# Patient Record
Sex: Female | Born: 1975 | Race: Black or African American | Hispanic: No | Marital: Single | Smoking: Former smoker
Health system: Southern US, Community
[De-identification: ages and names within clinical notes are randomized; demographics above are authoritative.]

## PROBLEM LIST (undated history)

## (undated) DIAGNOSIS — E559 Vitamin D deficiency, unspecified: Secondary | ICD-10-CM

## (undated) DIAGNOSIS — D573 Sickle-cell trait: Secondary | ICD-10-CM

## (undated) DIAGNOSIS — M722 Plantar fascial fibromatosis: Secondary | ICD-10-CM

## (undated) DIAGNOSIS — F32A Depression, unspecified: Secondary | ICD-10-CM

## (undated) DIAGNOSIS — F329 Major depressive disorder, single episode, unspecified: Secondary | ICD-10-CM

## (undated) HISTORY — PX: BREAST EXCISIONAL BIOPSY: SUR124

## (undated) HISTORY — PX: INTRAUTERINE DEVICE (IUD) INSERTION: SHX5877

## (undated) HISTORY — DX: Major depressive disorder, single episode, unspecified: F32.9

## (undated) HISTORY — PX: BREAST SURGERY: SHX581

## (undated) HISTORY — DX: Depression, unspecified: F32.A

## (undated) HISTORY — DX: Vitamin D deficiency, unspecified: E55.9

---

## 2007-02-04 ENCOUNTER — Ambulatory Visit: Payer: Self-pay | Admitting: Family Medicine

## 2007-02-07 ENCOUNTER — Encounter: Payer: Self-pay | Admitting: Family Medicine

## 2007-02-07 LAB — CONVERTED CEMR LAB
AST: 11 units/L (ref 0–37)
Alkaline Phosphatase: 34 units/L — ABNORMAL LOW (ref 39–117)
BUN: 6 mg/dL (ref 6–23)
Creatinine, Ser: 0.56 mg/dL (ref 0.40–1.20)
HCT: 34 % — ABNORMAL LOW (ref 36.0–46.0)
HDL: 52 mg/dL (ref >39)
Hemoglobin: 11.5 g/dL — ABNORMAL LOW (ref 12.0–15.0)
LDL Cholesterol: 86 mg/dL (ref 0–99)
MCHC: 33.8 g/dL (ref 30.0–36.0)
Platelets: 266 10*3/uL (ref 150–400)
RDW: 13.9 % (ref 11.5–14.0)
Total Bilirubin: 0.7 mg/dL (ref 0.3–1.2)
Total CHOL/HDL Ratio: 2.8
VLDL: 9 mg/dL (ref 0–40)

## 2007-02-08 ENCOUNTER — Telehealth (INDEPENDENT_AMBULATORY_CARE_PROVIDER_SITE_OTHER): Payer: Self-pay | Admitting: *Deleted

## 2007-02-08 ENCOUNTER — Encounter: Payer: Self-pay | Admitting: Family Medicine

## 2007-02-08 LAB — CONVERTED CEMR LAB: Ferritin: 59 ng/mL (ref 10–291)

## 2007-02-11 ENCOUNTER — Telehealth: Payer: Self-pay | Admitting: Family Medicine

## 2008-06-08 ENCOUNTER — Other Ambulatory Visit: Admission: RE | Admit: 2008-06-08 | Discharge: 2008-06-08 | Payer: Self-pay | Admitting: Family Medicine

## 2008-06-08 ENCOUNTER — Ambulatory Visit: Payer: Self-pay | Admitting: Family Medicine

## 2008-06-08 ENCOUNTER — Encounter: Payer: Self-pay | Admitting: Family Medicine

## 2008-06-09 LAB — CONVERTED CEMR LAB
Hemoglobin: 12.6 g/dL (ref 12.0–15.0)
RBC: 4.38 M/uL (ref 3.87–5.11)
RDW: 13.5 % (ref 11.5–15.5)
Saturation Ratios: 26 % (ref 20–55)
TIBC: 326 ug/dL (ref 250–470)
WBC: 6.2 10*3/uL (ref 4.0–10.5)

## 2008-09-09 ENCOUNTER — Emergency Department (HOSPITAL_BASED_OUTPATIENT_CLINIC_OR_DEPARTMENT_OTHER): Admission: EM | Admit: 2008-09-09 | Discharge: 2008-09-09 | Payer: Self-pay | Admitting: Emergency Medicine

## 2012-09-28 DIAGNOSIS — L709 Acne, unspecified: Secondary | ICD-10-CM | POA: Insufficient documentation

## 2012-09-28 DIAGNOSIS — D573 Sickle-cell trait: Secondary | ICD-10-CM | POA: Insufficient documentation

## 2014-08-12 ENCOUNTER — Ambulatory Visit (INDEPENDENT_AMBULATORY_CARE_PROVIDER_SITE_OTHER): Payer: 59 | Admitting: Family Medicine

## 2014-08-12 ENCOUNTER — Encounter: Payer: Self-pay | Admitting: Family Medicine

## 2014-08-12 VITALS — BP 118/61 | HR 70 | Ht 68.0 in | Wt 200.0 lb

## 2014-08-12 DIAGNOSIS — Z800 Family history of malignant neoplasm of digestive organs: Secondary | ICD-10-CM | POA: Diagnosis not present

## 2014-08-12 DIAGNOSIS — Z0000 Encounter for general adult medical examination without abnormal findings: Secondary | ICD-10-CM

## 2014-08-12 DIAGNOSIS — Z8742 Personal history of other diseases of the female genital tract: Secondary | ICD-10-CM | POA: Diagnosis not present

## 2014-08-12 DIAGNOSIS — D573 Sickle-cell trait: Secondary | ICD-10-CM | POA: Diagnosis not present

## 2014-08-12 NOTE — Progress Notes (Signed)
CC: Alexandra Turner is a 39 y.o. female is here for Establish Care   Subjective: HPI:  Colonoscopy: Mom age 38, screening to begin at age 38 Papsmear: history of abnormals but this is managed by her OB/GYN, due for repeat reminded to follow-up with GYN. 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  Requesting complete physical exam with no complaints other than fatigue. Works 2 jobs, single mother of a 38-year-old, works out 5 times a week. Still feels like she is struggling with energy on a daily basis but denies nonrestorative sleep. No shortness of breath.  Review of Systems - General ROS: negative for - chills, fever, night sweats, weight gain or weight loss Ophthalmic ROS: negative for - decreased vision Psychological ROS: negative for - anxiety or depression ENT ROS: negative for - hearing change, nasal congestion, tinnitus or allergies Hematological and Lymphatic ROS: negative for - bleeding problems, bruising or swollen lymph nodes Breast ROS: negative Respiratory ROS: no cough, shortness of breath, or wheezing Cardiovascular ROS: no chest pain or dyspnea on exertion Gastrointestinal ROS: no abdominal pain, change in bowel habits, or black or bloody stools Genito-Urinary ROS: negative for - genital discharge, genital ulcers, incontinence or abnormal bleeding from genitals Musculoskeletal ROS: negative for - joint pain or muscle pain Neurological ROS: negative for - headaches or memory loss Dermatological ROS: negative for lumps, mole changes, rash and skin lesion changes  No past medical history on file.  No past surgical history on file. No family history on file.  History   Social  History  . Marital Status: Single    Spouse Name: N/A  . Number of Children: N/A  . Years of Education: N/A   Occupational History  . Not on file.   Social History Main Topics  . Smoking status: Not on file  . Smokeless tobacco: Not on file  . Alcohol Use: Not on file  . Drug Use: Not on file  . Sexual Activity: Not on file   Other Topics Concern  . Not on file   Social History Narrative  . No narrative on file     Objective: BP 118/61 mmHg  Pulse 70  Ht 5\' 8"  (1.727 m)  Wt 200 lb (90.719 kg)  BMI 30.42 kg/m2  General: No Acute Distress HEENT: Atraumatic, normocephalic, conjunctivae normal without scleral icterus.  No nasal discharge, hearing grossly intact, TMs with good landmarks bilaterally with no middle ear abnormalities, posterior pharynx clear without oral lesions. Neck: Supple, trachea midline, no cervical nor supraclavicular adenopathy. Pulmonary: Clear to auscultation bilaterally without wheezing, rhonchi, nor rales. Cardiac: Regular rate and rhythm.  No murmurs, rubs, nor gallops. No peripheral edema.  2+ peripheral pulses bilaterally. Abdomen: Bowel sounds normal.  No masses.  Non-tender without rebound.  Negative Murphy's sign. MSK: Grossly intact, no signs of weakness.  Full strength throughout upper and lower extremities.  Full ROM in upper and lower extremities.  No midline spinal tenderness. Neuro: Gait unremarkable, CN II-XII grossly intact.  C5-C6 Reflex 2/4 Bilaterally, L4 Reflex 2/4 Bilaterally.  Cerebellar function intact. Skin: No rashes. Psych: Alert and oriented to person/place/time.  Thought process normal. No anxiety/depression.  Assessment & Plan: [name] was seen today for establish care.  Diagnoses and all orders  for this visit:  Annual physical exam Orders: -     Lipid panel -     COMPLETE METABOLIC PANEL WITH GFR -     TSH -     CBC  Sickle cell trait  Family history of colon cancer  History of abnormal cervical Pap  smear   Healthy lifestyle interventions including but not limited to regular exercise, a healthy low fat diet, moderation of salt intake, the dangers of tobacco/alcohol/recreational drug use, nutrition supplementation, and accident avoidance were discussed with the patient and a handout was provided for future reference.  No Follow-up on file.

## 2014-08-19 LAB — CBC
HCT: 35.1 % — ABNORMAL LOW (ref 36.0–46.0)
HEMOGLOBIN: 11.5 g/dL — AB (ref 12.0–15.0)
MCH: 27.1 pg (ref 26.0–34.0)
MCHC: 32.8 g/dL (ref 30.0–36.0)
MCV: 82.8 fL (ref 78.0–100.0)
MPV: 9.8 fL (ref 8.6–12.4)
Platelets: 311 10*3/uL (ref 150–400)
RBC: 4.24 MIL/uL (ref 3.87–5.11)
RDW: 14.8 % (ref 11.5–15.5)
WBC: 3.9 10*3/uL — ABNORMAL LOW (ref 4.0–10.5)

## 2014-08-20 LAB — TSH: TSH: 0.717 u[IU]/mL (ref 0.350–4.500)

## 2014-08-20 LAB — COMPLETE METABOLIC PANEL WITH GFR
ALBUMIN: 4.2 g/dL (ref 3.5–5.2)
ALT: 15 U/L (ref 0–35)
AST: 21 U/L (ref 0–37)
Alkaline Phosphatase: 61 U/L (ref 39–117)
BUN: 7 mg/dL (ref 6–23)
CALCIUM: 9.5 mg/dL (ref 8.4–10.5)
CHLORIDE: 104 meq/L (ref 96–112)
CO2: 25 meq/L (ref 19–32)
CREATININE: 0.68 mg/dL (ref 0.50–1.10)
GFR, Est Non African American: >89 mL/min
Glucose, Bld: 84 mg/dL (ref 70–99)
POTASSIUM: 4.3 meq/L (ref 3.5–5.3)
Sodium: 141 mEq/L (ref 135–145)
Total Bilirubin: 0.6 mg/dL (ref 0.2–1.2)
Total Protein: 7.4 g/dL (ref 6.0–8.3)

## 2014-08-20 LAB — LIPID PANEL
CHOLESTEROL: 169 mg/dL (ref 0–200)
HDL: 72 mg/dL (ref >=46)
LDL Cholesterol: 86 mg/dL (ref 0–99)
TRIGLYCERIDES: 54 mg/dL (ref <150)
Total CHOL/HDL Ratio: 2.3 Ratio
VLDL: 11 mg/dL (ref 0–40)

## 2016-02-14 ENCOUNTER — Emergency Department (INDEPENDENT_AMBULATORY_CARE_PROVIDER_SITE_OTHER): Payer: 59

## 2016-02-14 ENCOUNTER — Emergency Department (INDEPENDENT_AMBULATORY_CARE_PROVIDER_SITE_OTHER)
Admission: EM | Admit: 2016-02-14 | Discharge: 2016-02-14 | Disposition: A | Discharge status: Home / Self Care | Payer: 59 | Source: Home / Self Care | Attending: Family Medicine | Admitting: Family Medicine

## 2016-02-14 ENCOUNTER — Encounter: Payer: Self-pay | Admitting: *Deleted

## 2016-02-14 DIAGNOSIS — M47896 Other spondylosis, lumbar region: Secondary | ICD-10-CM

## 2016-02-14 DIAGNOSIS — S39012A Strain of muscle, fascia and tendon of lower back, initial encounter: Secondary | ICD-10-CM

## 2016-02-14 HISTORY — DX: Sickle-cell trait: D57.3

## 2016-02-14 MED ORDER — MELOXICAM 15 MG PO TABS: 15.0000 mg | ORAL_TABLET | Freq: Every day | ORAL | 1 refills | Status: DC

## 2016-02-14 MED ORDER — CYCLOBENZAPRINE HCL 10 MG PO TABS: ORAL_TABLET | ORAL | 1 refills | Status: DC

## 2016-02-14 MED ORDER — IBUPROFEN 400 MG PO TABS
400.0000 mg | ORAL_TABLET | Freq: Once | ORAL | Status: AC
  Administered 2016-02-14: 400 mg via ORAL

## 2016-02-14 NOTE — ED Triage Notes (Signed)
Pt reports left low back pain that started suddenly this AM when she coughed and heard a pop. She has decreased ROM. Within the past 2 weeks c/o some low back pain after exercising that was relieved by exercises.

## 2016-02-14 NOTE — Discharge Instructions (Addendum)
Apply ice pack for 20 to 30 minutes, 3 to 4 times daily  Continue until pain decreases.  °Begin range of motion and stretching exercises as tolerated. °

## 2016-02-14 NOTE — ED Provider Notes (Signed)
[facility]    CSN: [id number] Arrival date & time: 02/14/16  0901     History   Chief Complaint Chief Complaint  Patient presents with  . Back Pain    HPI Alexandra Turner is a 40 y.o. female.   Patient has noticed mild low back pain after exercising during the past two weeks.  She works out regularly.  Last week after using a leg press machine she was more sore than usual.  This morning while in her shower she coughed and felt sudden pain in her left lower back.  The pain does not radiate.  She denies bowel or bladder dysfunction, and no saddle numbness.    The history is provided by the patient and a parent.  Back Pain  Location:  Lumbar spine Quality:  Aching Radiates to:  Does not radiate Pain severity:  Moderate Pain is:  Same all the time Onset quality:  Sudden Duration:  3 hours Timing:  Constant Progression:  Unchanged Chronicity:  New Context comment:  Athletic activity Relieved by:  None tried Worsened by:  Movement Ineffective treatments:  None tried Associated symptoms: no abdominal pain, no abdominal swelling, no bladder incontinence, no bowel incontinence, no dysuria, no fever, no leg pain, no numbness, no paresthesias, no pelvic pain, no perianal numbness, no tingling and no weakness     Past Medical History:  Diagnosis Date  . Sickle cell trait [facility])     Patient Active Problem List   Diagnosis Date Noted  . Sickle cell trait (HCC) 08/12/2014  . Family history of colon cancer 08/12/2014  . History of abnormal cervical Pap smear 08/12/2014    Past Surgical History:  Procedure Laterality Date  . BREAST SURGERY     excision  . CESAREAN SECTION      OB History    No data available       Home Medications    Prior to Admission medications   Medication Sig Start Date End Date Taking? Authorizing Provider  cyclobenzaprine (FLEXERIL) 10 MG tablet Take one tab by mouth HS for muscle spasm. 02/14/16   Lattie Haw, MD  meloxicam  (MOBIC) 15 MG tablet Take 1 tablet (15 mg total) by mouth daily. Take with food each morning 02/14/16   Lattie Haw, MD    Family History Family History  Problem Relation Age of Onset  . Cancer Mother   . Cancer Sister     Social History Social History  Substance Use Topics  . Smoking status: Never Smoker  . Smokeless tobacco: Never Used  . Alcohol use 4.2 oz/week    7 Standard drinks or equivalent per week     Allergies   Review of patient's allergies indicates no known allergies.   Review of Systems Review of Systems  Constitutional: Negative for fever.  Gastrointestinal: Negative for abdominal pain and bowel incontinence.  Genitourinary: Negative for bladder incontinence, dysuria and pelvic pain.  Musculoskeletal: Positive for back pain.  Neurological: Negative for tingling, weakness, numbness and paresthesias.  All other systems reviewed and are negative.    Physical Exam Triage Vital Signs ED Triage Vitals  Enc Vitals Group     BP 02/14/16 0919 112/74     Pulse Rate 02/14/16 0919 68     Resp --      Temp 02/14/16 0919 98.2 F (36.8 C)     Temp Source 02/14/16 0919 Oral     SpO2 02/14/16 0919 99 %     Weight  02/14/16 0919 186 lb (84.4 kg)     Height --      Head Circumference --      Peak Flow --      Pain Score 02/14/16 0922 9     Pain Loc --      Pain Edu? --      Excl. in GC? --    No data found.   Updated Vital Signs BP 112/74 (BP Location: Left Arm)   Pulse 68   Temp 98.2 F (36.8 C) (Oral)   Wt 186 lb (84.4 kg)   LMP 01/20/2016   SpO2 99%   BMI 28.28 kg/m   Visual Acuity Right Eye Distance:   Left Eye Distance:   Bilateral Distance:    Right Eye Near:   Left Eye Near:    Bilateral Near:     Physical Exam  Constitutional: She appears well-developed and well-nourished. No distress.  HENT:  Head: Normocephalic.  Mouth/Throat: Oropharynx is clear and moist.  Eyes: Conjunctivae are normal. Pupils are equal, round, and  reactive to light.  Neck: Normal range of motion.  Cardiovascular: Normal heart sounds.   Pulmonary/Chest: Breath sounds normal.  Abdominal: There is no tenderness.  Musculoskeletal: She exhibits no edema.       Lumbar back: She exhibits tenderness. She exhibits normal range of motion, no bony tenderness and no swelling.       Back:  Back:   Can heel/toe walk and squat without difficulty.  Decreased forward flexion.  Tenderness in the midline and left paraspinous muscles from L3 to Sacral area.  Tenderness over SI joint.  Straight leg raising test is negative.  Sitting knee extension test is negative.  Strength and sensation in the lower extremities is normal.  Patellar and achilles reflexes are normal   Neurological: She is alert.  Skin: Skin is warm and dry. No rash noted.  Nursing note and vitals reviewed.    UC Treatments / Results  Labs (all labs ordered are listed, but only abnormal results are displayed) Labs Reviewed - No data to display  EKG  EKG Interpretation None       Radiology Dg Lumbar Spine Complete  Result Date: 02/14/2016 CLINICAL DATA:  Left lower back pain for 4-5 days EXAM: LUMBAR SPINE - COMPLETE 4+ VIEW COMPARISON:  None. FINDINGS: There are 5 nonrib bearing lumbar-type vertebral bodies. The vertebral body heights are maintained. There is 2 mm of retrolisthesis of L4 on L5. There is no spondylolysis. There is no acute fracture. There is minimal degenerative disc disease at L4-5 and L5-S1. The SI joints are unremarkable. IMPRESSION: Mild lumbar spine spondylosis. Electronically Signed   By: Elige KoHetal  Patel   On: 02/14/2016 10:41    Procedures Procedures (including critical care time)  Medications Ordered in UC Medications  ibuprofen (ADVIL,MOTRIN) tablet 400 mg (400 mg Oral Given 02/14/16 [id number])     Initial Impression / Assessment and Plan / UC Course  I have reviewed the triage vital signs and the nursing notes.  Pertinent labs & imaging results that  were available during my care of the patient were reviewed by me and considered in my medical decision making (see chart for details).  Clinical Course  No evidence radiculopathy. Begin Mobic and Flexeril. Apply ice pack for 20 to 30 minutes, 3 to 4 times daily  Continue until pain decreases. Begin range of motion and stretching exercises as tolerated. Followup with Dr. Rodney Langtonhomas Thekkekandam or Dr. Clementeen GrahamEvan Corey ([facility]) if not  improving about two weeks.      Final Clinical Impressions(s) / UC Diagnoses   Final diagnoses:  Low back strain, initial encounter    New Prescriptions New Prescriptions   CYCLOBENZAPRINE (FLEXERIL) 10 MG TABLET    Take one tab by mouth HS for muscle spasm.   MELOXICAM (MOBIC) 15 MG TABLET    Take 1 tablet (15 mg total) by mouth daily. Take with food each morning     Lattie Haw, MD 02/18/16 [id number]

## 2016-03-02 ENCOUNTER — Encounter: Payer: Self-pay | Admitting: Family Medicine

## 2016-03-02 ENCOUNTER — Ambulatory Visit (INDEPENDENT_AMBULATORY_CARE_PROVIDER_SITE_OTHER): Payer: 59 | Admitting: Family Medicine

## 2016-03-02 DIAGNOSIS — S39012A Strain of muscle, fascia and tendon of lower back, initial encounter: Secondary | ICD-10-CM | POA: Diagnosis not present

## 2016-03-02 NOTE — Progress Notes (Signed)
   Subjective:    I'm seeing this patient as a consultation for:  Dr. Donna ChristenStephen Beese  CC: Back pain  HPI: Patient first had left-sided achy lower back pain after using a leg press machine at the gym 1 month ago. The pain does not radiate anywhere. She denies numbness, tingling, or weakness in her lower extremities; no bowel or bladder incontinence. Her physical therapist friend gave her leg lift and abduction exercises that helped the pain somewhat. 2 weeks ago, she was standing in the shower when she coughed and felt sharp pain in her left hip and lower back. She then went to urgent care, where she was diagnosed with a low back strain and given cyclobenzaprine, meloxicam, and advised to ice/heath and do ROM/stretching exercises. Her pain improved until this past weekend, when it gradually worsened after a gym workout involving body weight squats and leg machines. She feels that the PT exercises and heat have helped her pain the most.  Past medical history, Surgical history, Family history not pertinant except as noted below, Social history, Allergies, and medications have been entered into the medical record, reviewed, and no changes needed.   Review of Systems: No headache, visual changes, nausea, vomiting, diarrhea, constipation, dizziness, abdominal pain, skin rash, fevers, chills, night sweats, weight loss, swollen lymph nodes, joint swelling, chest pain, shortness of breath, mood changes, visual or auditory hallucinations.   Objective:    Vitals:   03/02/16 1445  BP: 116/71  Pulse: (!) 105   General: Well Developed, well nourished, and in no acute distress.  Neuro/Psych: Alert and oriented x3, extra-ocular muscles intact, able to move all 4 extremities, sensation grossly intact. Skin: Warm and dry, no rashes noted.  Respiratory: Not using accessory muscles, speaking in full sentences, trachea midline.  Cardiovascular: Pulses palpable, no extremity edema. Abdomen: Does not appear  distended. MSK: Tenderness to palpation over left SI joint. Pearlean BrownieFaber and Fader tests normal bilaterally; pretzel stretch painful on L side. Pressure over pelvis yielded less SI joint movement on left side. Slump test negative. Full strength and intact sensation in bilateral lower extremities. Patellar reflexes 2+ bilaterally.  Xray Lspine dated 10/23: CLINICAL DATA:  Left lower back pain for 4-5 days  EXAM: LUMBAR SPINE - COMPLETE 4+ VIEW  COMPARISON:  None.  FINDINGS: There are 5 nonrib bearing lumbar-type vertebral bodies.  The vertebral body heights are maintained.  There is 2 mm of retrolisthesis of L4 on L5. There is no spondylolysis.  There is no acute fracture.  There is minimal degenerative disc disease at L4-5 and L5-S1.  The SI joints are unremarkable.  IMPRESSION: Mild lumbar spine spondylosis.   Electronically Signed   By: Elige KoHetal  Patel   On: 02/14/2016 10:41  Impression and Recommendations:    Assessment and Plan: 40 y.o. female with 1 month of pain over the left SI joint precipitated and exacerbated by exercise. Pain reproduced with point palpation, pretzel stretch, and application of pelvic pressure on exam. Most likely acute myofascial back strain. Unofficial PT has helped her in the past. Referred to PT. Continue applying heat. Follow up in 2-4 weeks if not better.   Discussed warning signs or symptoms. Please see discharge instructions. Patient expresses understanding.

## 2016-03-02 NOTE — Patient Instructions (Signed)
Thank you for coming in today. Return in 2-4 weeks if not better.  Come back or go to the emergency room if you notice new weakness new numbness problems walking or bowel or bladder problems.  TENS UNIT: This is helpful for muscle pain and spasm.   Search and Purchase a TENS 7000 2nd edition at www.tenspros.com. Or [employer] It should be less than $30.     TENS unit instructions: Do not shower or bathe with the unit on Turn the unit off before removing electrodes or batteries If the electrodes lose stickiness add a drop of water to the electrodes after they are disconnected from the unit and place on plastic sheet. If you continued to have difficulty, call the TENS unit company to purchase more electrodes. Do not apply lotion on the skin area prior to use. Make sure the skin is clean and dry as this will help prolong the life of the electrodes. After use, always check skin for unusual red areas, rash or other skin difficulties. If there are any skin problems, does not apply electrodes to the same area. Never remove the electrodes from the unit by pulling the wires. Do not use the TENS unit or electrodes other than as directed. Do not change electrode placement without consultating your therapist or physician. Keep 2 fingers with between each electrode. Wear time ratio is 2:1, on to off times.    For example on for 30 minutes off for 15 minutes and then on for 30 minutes off for 15 minutes    Lumbosacral Strain Lumbosacral strain is a strain of any of the parts that make up your lumbosacral vertebrae. Your lumbosacral vertebrae are the bones that make up the lower third of your backbone. Your lumbosacral vertebrae are held together by muscles and tough, fibrous tissue (ligaments).  CAUSES  A sudden blow to your back can cause lumbosacral strain. Also, anything that causes an excessive stretch of the muscles in the low back can cause this strain. This is typically seen when people exert  themselves strenuously, fall, lift heavy objects, bend, or crouch repeatedly. RISK FACTORS  Physically demanding work.  Participation in pushing or pulling sports or sports that require a sudden twist of the back (tennis, golf, baseball).  Weight lifting.  Excessive lower back curvature.  Forward-tilted pelvis.  Weak back or abdominal muscles or both.  Tight hamstrings. SIGNS AND SYMPTOMS  Lumbosacral strain may cause pain in the area of your injury or pain that moves (radiates) down your leg.  DIAGNOSIS Your health care provider can often diagnose lumbosacral strain through a physical exam. In some cases, you may need tests such as X-ray exams.  TREATMENT  Treatment for your lower back injury depends on many factors that your clinician will have to evaluate. However, most treatment will include the use of anti-inflammatory medicines. HOME CARE INSTRUCTIONS   Avoid hard physical activities (tennis, racquetball, waterskiing) if you are not in proper physical condition for it. This may aggravate or create problems.  If you have a back problem, avoid sports requiring sudden body movements. Swimming and walking are generally safer activities.  Maintain good posture.  Maintain a healthy weight.  For acute conditions, you may put ice on the injured area.  Put ice in a plastic bag.  Place a towel between your skin and the bag.  Leave the ice on for 20 minutes, 2-3 times a day.  When the low back starts healing, stretching and strengthening exercises may be recommended. SEEK  MEDICAL CARE IF:  Your back pain is getting worse.  You experience severe back pain not relieved with medicines. SEEK IMMEDIATE MEDICAL CARE IF:   You have numbness, tingling, weakness, or problems with the use of your arms or legs.  There is a change in bowel or bladder control.  You have increasing pain in any area of the body, including your belly (abdomen).  You notice shortness of breath,  dizziness, or feel faint.  You feel sick to your stomach (nauseous), are throwing up (vomiting), or become sweaty.  You notice discoloration of your toes or legs, or your feet get very cold. MAKE SURE YOU:   Understand these instructions.  Will watch your condition.  Will get help right away if you are not doing well or get worse.   This information is not intended to replace advice given to you by your health care provider. Make sure you discuss any questions you have with your health care provider.   Document Released: 01/18/2005 Document Revised: 05/01/2014 Document Reviewed: 11/27/2012 [employer] Interactive Patient Education [employer]2016 [employer]

## 2016-03-13 ENCOUNTER — Ambulatory Visit (INDEPENDENT_AMBULATORY_CARE_PROVIDER_SITE_OTHER): Payer: 59 | Admitting: Rehabilitative and Restorative Service Providers"

## 2016-03-13 ENCOUNTER — Encounter: Payer: Self-pay | Admitting: Rehabilitative and Restorative Service Providers"

## 2016-03-13 DIAGNOSIS — M545 Low back pain, unspecified: Secondary | ICD-10-CM

## 2016-03-13 DIAGNOSIS — R29898 Other symptoms and signs involving the musculoskeletal system: Secondary | ICD-10-CM

## 2016-03-13 NOTE — Patient Instructions (Signed)
Abdominal Bracing With Pelvic Floor (Hook-Lying)    With neutral spine, tighten pelvic floor and abdominals sucking belly button to back bone; tighten muscles in low back at waist. Hold 10 sec (or longer)  Repeat _10_ times.  Do _several__ times a day. Progress to do this in sitting; standing; walking and with functional activities and in the gym   Quads / HF, Supine   Lie near edge of bed, pull both knees up toward chest. Hold one knee as you drop the other leg off the edge of the bed.  Relax hanging knee/can bend knee back if indicated. Hold 30 seconds. Repeat 3 times per session. Do 2-3 sessions per day.  Cat / Cow Flow    Inhale, press spine toward ceiling like a Halloween cat. Keeping strength in arms and abdominals, exhale to soften spine through neutral and into cow pose. Open chest and arch back. Initiate movement between cat and cow at tailbone, one vertebrae at a time. Repeat __5__ times.   Sleeping on Back  Place pillow under knees. A pillow with cervical support and a roll around waist are also helpful. Copyright  VHI. All rights reserved.  Sleeping on Side Place pillow between knees. Use cervical support under neck and a roll around waist as needed. Copyright  VHI. All rights reserved.   Sleeping on Stomach   If this is the only desirable sleeping position, place pillow under lower legs, and under stomach or chest as needed.  Posture - Sitting   Sit upright, head facing forward. Try using a roll to support lower back. Keep shoulders relaxed, and avoid rounded back. Keep hips level with knees. Avoid crossing legs for long periods. Stand to Sit / Sit to Stand   To sit: Bend knees to lower self onto front edge of chair, then scoot back on seat. To stand: Reverse sequence by placing one foot forward, and scoot to front of seat. Use rocking motion to stand up.   Work Height and Reach  Ideal work height is no more than 2 to 4 inches below elbow level when  standing, and at elbow level when sitting. Reaching should be limited to arm's length, with elbows slightly bent.  Bending  Bend at hips and knees, not back. Keep feet shoulder-width apart.    Posture - Standing   Good posture is important. Avoid slouching and forward head thrust. Maintain curve in low back and align ears over shoul- ders, hips over ankles.  Alternating Positions   Alternate tasks and change positions frequently to reduce fatigue and muscle tension. Take rest breaks. Computer Work   Position work to Art gallery managerface forward. Use proper work and seat height. Keep shoulders back and down, wrists straight, and elbows at right angles. Use chair that provides full back support. Add footrest and lumbar roll as needed.  Getting Into / Out of Car  Lower self onto seat, scoot back, then bring in one leg at a time. Reverse sequence to get out.  Dressing  Lie on back to pull socks or slacks over feet, or sit and bend leg while keeping back straight.    Housework - Sink  Place one foot on ledge of cabinet under sink when standing at sink for prolonged periods.   Pushing / Pulling  Pushing is preferable to pulling. Keep back in proper alignment, and use leg muscles to do the work.  Deep Squat   Squat and lift with both arms held against upper trunk. Tighten stomach muscles without  holding breath. Use smooth movements to avoid jerking.  Avoid Twisting   Avoid twisting or bending back. Pivot around using foot movements, and bend at knees if needed when reaching for articles.  Carrying Luggage   Distribute weight evenly on both sides. Use a cart whenever possible. Do not twist trunk. Move body as a unit.   Lifting Principles .Maintain proper posture and head alignment. .Slide object as close as possible before lifting. .Move obstacles out of the way. .Test before lifting; ask for help if too heavy. .Tighten stomach muscles without holding breath. .Use smooth  movements; do not jerk. .Use legs to do the work, and pivot with feet. .Distribute the work load symmetrically and close to the center of trunk. .Push instead of pull whenever possible.   Ask For Help   Ask for help and delegate to others when possible. Coordinate your movements when lifting together, and maintain the low back curve.  Log Roll   Lying on back, bend left knee and place left arm across chest. Roll all in one movement to the right. Reverse to roll to the left. Always move as one unit. Housework - Sweeping  Use long-handled equipment to avoid stooping.   Housework - Wiping  Position yourself as close as possible to reach work surface. Avoid straining your back.  Laundry - Unloading Wash   To unload small items at bottom of washer, lift leg opposite to arm being used to reach.  Gardening - Raking  Move close to area to be raked. Use arm movements to do the work. Keep back straight and avoid twisting.     Cart  When reaching into cart with one arm, lift opposite leg to keep back straight.   Getting Into / Out of Bed  Lower self to lie down on one side by raising legs and lowering head at the same time. Use arms to assist moving without twisting. Bend both knees to roll onto back if desired. To sit up, start from lying on side, and use same move-ments in reverse. Housework - Vacuuming  Hold the vacuum with arm held at side. Step back and forth to move it, keeping head up. Avoid twisting.   Laundry - Armed forces training and education officerLoading Wash  Position laundry basket so that bending and twisting can be avoided.   Laundry - Unloading Dryer  Squat down to reach into clothes dryer or use a reacher.  Gardening - Weeding / Psychiatric nurselanting  Squat or Kneel. Knee pads may be helpful.

## 2016-03-13 NOTE — Therapy (Signed)
[facility] [address] [place], [place], [address] Phone: [phone]   Fax:  [phone]  Physical Therapy Evaluation  Patient Details  Name: Alexandra Turner MRN: [id number] Date of Birth: 03-16-1976 Referring Provider: Dr Clementeen GrahamEvan Corey   Encounter Date: 03/13/2016      PT End of Session - 03/13/16 1559    Visit Number 1   Number of Visits 6   Date for PT Re-Evaluation 04/25/16   PT Start Time 1559   PT Stop Time 1656   PT Time Calculation (min) 57 min   Activity Tolerance Patient tolerated treatment well      Past Medical History:  Diagnosis Date  . Sickle cell trait [facility](HCC)     Past Surgical History:  Procedure Laterality Date  . BREAST SURGERY     excision  . CESAREAN SECTION      There were no vitals filed for this visit.       Subjective Assessment - 03/13/16 1602    Subjective Patient reports that she noticed LBP ~ 1 onth ago while working out at [employer] gym. She was working on the leg press when she felt pain in the Rt LB. She was doing some exercises she had from her friend who is a PT. Symptoms improved. She noted a "pop" and felt pain in the Lt LB area effecting sleep. She improved with meds and rest. She returned to the gym and noticed some pain in the Rt LB area. Has continued with the gym program and started using the foam roller which has helped. She has some aching and discomfort in the LB area with prolonged standing or household chores/shopping - back will "get tired". She continues to move carefully to protect back. No pain today but would like to prevent LBP from returning.    How long can you sit comfortably? 5-10 min - some aching 1/10   How long can you stand comfortably? 30 min    How long can you walk comfortably? 30-45 min    Diagnostic tests xrays (-)    Patient Stated Goals mild spondylosis; mild DDD L4/5; L5/S1   Currently in Pain? No/denies   Pain Location Back   Pain Orientation  Right;Lower   Pain Descriptors / Indicators Aching   Pain Type Chronic pain  on and off for ~ 5 months - progressively worsening with flare ups    Pain Onset More than a month ago   Pain Frequency Intermittent   Aggravating Factors  bending forward; stooping    Pain Relieving Factors heating pad            [facility] PT Assessment - 03/13/16 0001      Assessment   Medical Diagnosis LBP    Referring Provider Dr Clementeen GrahamEvan Corey    Onset Date/Surgical Date 01/27/16   Hand Dominance Right   Next MD Visit PRN   Prior Therapy none      Precautions   Precautions None     Balance Screen   Has the patient fallen in the past 6 months No   Has the patient had a decrease in activity level because of a fear of falling?  No   Is the patient reluctant to leave their home because of a fear of falling?  No     Prior Function   Level of Independence Independent   Vocation Full time employment   MudloggerVocation Requirements social worker - sitting; driving - 8 hr/day - 8 yrs  Leisure in the gym ~3 days/wk; treadmill ~10 min; machines and free weights 40 min some stretching      Observation/Other Assessments   Focus on Therapeutic Outcomes (FOTO)  28% limitation      Sensation   Additional Comments WNL's per pt report      Posture/Postural Control   Posture Comments head forward; incresaed thoracic kyphosis; knees hyperextended     AROM   Right/Left Hip --  tight hip flexors Lt > Rt    Lumbar Flexion 95%  pulling LB with return to stand   Lumbar Extension 50%   Lumbar - Right Side Bend 65%  discomfort Rt LB   Lumbar - Left Side Bend 60%  discomfort Lt LB   Lumbar - Right Rotation 45%   Lumbar - Left Rotation 45%     Strength   Overall Strength Comments 5/5 bilat LE's      Flexibility   Soft Tissue Assessment /Muscle Length --  tight hip flexors bilat Lt > Rt    Hamstrings WFL's   Quadriceps WFL's   ITB WFL's   Piriformis WFL's     Palpation   Spinal mobility pain and hypomobility  with PA mobs L5/4/3   SI assessment  no pain with palpation or spring testing    Palpation comment tight Lt > Rt hip flexors/sartorius; Lt > Rt lumbar paraspinals and QL                    [facility] Adult PT Treatment/Exercise - 03/13/16 0001      Self-Care   Self-Care --  initiated spine care education     Neuro Re-ed    Neuro Re-ed Details  --  initiated core training     Lumbar Exercises: Stretches   Hip Flexor Stretch 3 reps;30 seconds  knees to chest drop LE off edge of table      Lumbar Exercises: Supine   Other Supine Lumbar Exercises 3 part core 10 sec x 10 reps - difficulty with good contraction      Lumbar Exercises: Quadruped   Madcat/Old Horse 5 reps   Madcat/Old Horse Limitations difficulty w/ full ROM esp sag      Moist Heat Therapy   Number Minutes Moist Heat 15 Minutes   Moist Heat Location Lumbar Spine;Hip  bilat ant hips      Programme researcher, broadcasting/film/video Location bilat lumbar/QL   Electrical Stimulation Action IFC   Electrical Stimulation Parameters to tolerance   Electrical Stimulation Goals Pain;Tone                PT Education - 03/13/16 1649    Education provided Yes   Education Details HEP; initiated spine care   Person(s) Educated Patient   Methods Explanation;Demonstration;Tactile cues;Verbal cues;Handout   Comprehension Verbalized understanding;Returned demonstration;Verbal cues required;Tactile cues required             PT Long Term Goals - 03/13/16 1657      PT LONG TERM GOAL #1   Title Improve segmental spine mobility with patient to demonstrated spinal mobility at ~ 75% of ROM throughtout trunk 04/25/16   Time 6   Period Weeks   Status New     PT LONG TERM GOAL #2   Title Patient to demonstrate good core stability allowing her to participate in stabilization program without pain or difficult 04/25/16   Time 6   Period Weeks   Status New     PT LONG TERM  GOAL #3   Title Patient reports that  she can sit; stand; walk for functional times without increase in symptoms 04/25/16   Time 6   Period Weeks   Status New     PT LONG TERM GOAL #4   Title Independent in HEP 04/25/16   Time 6   Period Weeks   Status New     PT LONG TERM GOAL #5   Title Improve FOTO to </=15% limitation 04/25/16   Time 6   Period Weeks   Status New               Plan - 03/13/16 1653    Clinical Impression Statement Patient presents recurrent intermittent LBP over the past 1 1/2 to 2 years with most recent flare up ~ 6 wks ago. Patient has poor posture and alignment; poor body mechanics; poor core stability; poor lumbar segmental mobility; significant tightness through anterior hips; limited functional activity tolerance; difficulty sleeping; pain on an intermittent basis.    Rehab Potential Good   PT Frequency 1x / week   PT Duration 6 weeks   PT Treatment/Interventions Patient/family education;ADLs/Self Care Home Management;Cryotherapy;Electrical Stimulation;Iontophoresis 4mg /ml Dexamethasone;Moist Heat;Traction;Ultrasound;Dry needling;Manual techniques;Therapeutic activities;Therapeutic exercise   PT Next Visit Plan add myofacial ball release work for hip flexors; sartorius stretch; core stabilization; continue back care education    Consulted and Agree with Plan of Care Patient      Patient will benefit from skilled therapeutic intervention in order to improve the following deficits and impairments:  Postural dysfunction, Improper body mechanics, Pain, Decreased range of motion, Decreased mobility, Decreased strength, Decreased activity tolerance, Increased fascial restricitons, Increased muscle spasms  Visit Diagnosis: Acute bilateral low back pain without sciatica - Plan: PT plan of care cert/re-cert  Other symptoms and signs involving the musculoskeletal system - Plan: PT plan of care cert/re-cert     Problem List Patient Active Problem List   Diagnosis Date Noted  . Acute lumbosacral  myofascial strain 03/02/2016  . Sickle cell trait (HCC) 08/12/2014  . Family history of colon cancer 08/12/2014  . History of abnormal cervical Pap smear 08/12/2014    Oshen Wlodarczyk Rober MinionP Bronco Mcgrory PT, MPH  03/13/2016, 5:08 PM  [facility] [address] [place], [place], [address] Phone: [phone]   Fax:  [phone]  Name: Alexandra Turner MRN: [id number] Date of Birth: 09-23-1975

## 2016-03-20 ENCOUNTER — Encounter: Payer: Self-pay | Admitting: Rehabilitative and Restorative Service Providers"

## 2016-03-20 ENCOUNTER — Ambulatory Visit (INDEPENDENT_AMBULATORY_CARE_PROVIDER_SITE_OTHER): Payer: 59 | Admitting: Rehabilitative and Restorative Service Providers"

## 2016-03-20 DIAGNOSIS — R29898 Other symptoms and signs involving the musculoskeletal system: Secondary | ICD-10-CM

## 2016-03-20 DIAGNOSIS — M545 Low back pain, unspecified: Secondary | ICD-10-CM

## 2016-03-20 NOTE — Patient Instructions (Addendum)
Upper / Lower Extremity Extension (All-Fours)    Tighten core and raise right leg and opposite arm. Keep trunk rigid. Lift leg just to raise toe off surface. Repeat __10__ times per set. Do __1-2__ sets per session. Do __1__ sessions per day.   Bridging    Slowly raise buttocks from floor, keeping core tight. Hold 10 sec Repeat __10__ times per set. Do __1-2__ sets per session. Do __1__ sessions per day.

## 2016-03-20 NOTE — Therapy (Signed)
[facility] [address] [place], [place], [address] Phone: [phone]   Fax:  [phone]  Physical Therapy Treatment  Patient Details  Name: Alexandra Maneiffany M Turner MRN: [id number] Date of Birth: [birth year] Referring Provider: Dr Clementeen GrahamEvan Corey   Encounter Date: 03/20/2016      PT End of Session - 03/20/16 1520    Visit Number 2   Number of Visits 6   Date for PT Re-Evaluation 04/25/16   PT Start Time 1517   PT Stop Time 1615   PT Time Calculation (min) 58 min   Activity Tolerance Patient tolerated treatment well      Past Medical History:  Diagnosis Date  . Sickle cell trait [facility](HCC)     Past Surgical History:  Procedure Laterality Date  . BREAST SURGERY     excision  . CESAREAN SECTION      There were no vitals filed for this visit.      Subjective Assessment - 03/20/16 1522    Subjective Patient reports that she has been working on core exercises. She has been doing a lot of cooking for Thanksgiving which has irritated the back some. Did notice that shen she went to the gym she did not experience the increase in LBP that she has in the past.    Currently in Pain? Yes   Pain Score 2    Pain Location Back   Pain Orientation Right;Lower   Pain Descriptors / Indicators Aching   Pain Type Chronic pain   Pain Onset More than a month ago   Pain Frequency Intermittent                         [facility] Adult PT Treatment/Exercise - 03/20/16 0001      Therapeutic Activites    Therapeutic Activities --  myofacial ball release work prone      Lumbar Exercises: Stretches   Hip Flexor Stretch 3 reps;30 seconds  knees to chest drop LE off edge of table    Quad Stretch Limitations sartorius stretch 30 sec x 3      Lumbar Exercises: Supine   Bridge 10 reps  10 sec hold with core engaged    Other Supine Lumbar Exercises 3 part core 10 sec x 10 reps     Lumbar Exercises: Quadruped   Madcat/Old Horse 5  reps   Opposite Arm/Leg Raise Right arm/Left leg;Left arm/Right leg;10 reps;3 seconds     Moist Heat Therapy   Number Minutes Moist Heat 15 Minutes   Moist Heat Location Lumbar Spine;Hip  bilat ant hips      Programme researcher, broadcasting/film/videolectrical Stimulation   Electrical Stimulation Location bilat lumbar/QL   Electrical Stimulation Action IFC   Electrical Stimulation Parameters to tolerance   Electrical Stimulation Goals Pain;Tone     Manual Therapy   Manual therapy comments PT assisted stretch hip flexors in supine                 PT Education - 03/20/16 1602    Education provided Yes   Education Details HEP   Person(s) Educated Patient   Methods Explanation;Demonstration;Tactile cues;Verbal cues;Handout   Comprehension Verbalized understanding;Returned demonstration;Tactile cues required             PT Long Term Goals - 03/20/16 1521      PT LONG TERM GOAL #1   Title Improve segmental spine mobility with patient to demonstrated spinal mobility at ~ 75% of ROM throughtout  trunk 04/25/16   Time 6   Period Weeks   Status On-going     PT LONG TERM GOAL #2   Title Patient to demonstrate good core stability allowing her to participate in stabilization program without pain or difficult 04/25/16   Time 6   Period Weeks   Status On-going     PT LONG TERM GOAL #3   Title Patient reports that she can sit; stand; walk for functional times without increase in symptoms 04/25/16   Time 6   Period Weeks   Status On-going     PT LONG TERM GOAL #4   Title Independent in HEP 04/25/16   Time 6   Period Weeks   Status On-going     PT LONG TERM GOAL #5   Title Improve FOTO to </=15% limitation 04/25/16   Time 6   Period Weeks   Status On-going               Plan - 03/20/16 1604    Clinical Impression Statement Some initial improvement. Difficulty accomplishing hip flexor stretching for home and in clinic. Will try half kneeling stretch at next visit. Continued muscular tightness through the  quads and hip flexors bilaterally. Will try TDN at next visit.    Rehab Potential Good   PT Frequency 1x / week   PT Duration 6 weeks   PT Treatment/Interventions Patient/family education;ADLs/Self Care Home Management;Cryotherapy;Electrical Stimulation;Iontophoresis 4mg /ml Dexamethasone;Moist Heat;Traction;Ultrasound;Dry needling;Manual techniques;Therapeutic activities;Therapeutic exercise   PT Next Visit Plan half kneeling flexor stretch; TDN quads/hip flexors/lumbar musculature    Consulted and Agree with Plan of Care Patient      Patient will benefit from skilled therapeutic intervention in order to improve the following deficits and impairments:  Postural dysfunction, Improper body mechanics, Pain, Decreased range of motion, Decreased mobility, Decreased strength, Decreased activity tolerance, Increased fascial restricitons, Increased muscle spasms  Visit Diagnosis: Acute bilateral low back pain without sciatica  Other symptoms and signs involving the musculoskeletal system     Problem List Patient Active Problem List   Diagnosis Date Noted  . Acute lumbosacral myofascial strain 03/02/2016  . Sickle cell trait (HCC) 08/12/2014  . Family history of colon cancer 08/12/2014  . History of abnormal cervical Pap smear 08/12/2014    Aleta Manternach Rober MinionP Hersel Mcmeen PT, MPH  03/20/2016, 4:07 PM  [facility] [address] [place], [place], [address] Phone: [phone]   Fax:  [phone]  Name: Alexandra Maneiffany M Turner MRN: [id number] Date of Birth: 01/01/1976

## 2016-03-27 ENCOUNTER — Ambulatory Visit (INDEPENDENT_AMBULATORY_CARE_PROVIDER_SITE_OTHER): Payer: 59 | Admitting: Physical Therapy

## 2016-03-27 DIAGNOSIS — M545 Low back pain, unspecified: Secondary | ICD-10-CM

## 2016-03-27 DIAGNOSIS — R29898 Other symptoms and signs involving the musculoskeletal system: Secondary | ICD-10-CM | POA: Diagnosis not present

## 2016-03-27 NOTE — Therapy (Addendum)
[place], [place], [address] Phone: [phone]   Fax:  [phone]  Physical Therapy Treatment  Patient Details  Name: Alexandra Turner MRN: [id number] Date of Birth: [birth year] Referring Provider: Dr Lynne Leader   Encounter Date: 03/27/2016      PT End of Session - 03/27/16 1605    Visit Number 3   Number of Visits 6   Date for PT Re-Evaluation 04/25/16   PT Start Time 8381   PT Stop Time 8403   PT Time Calculation (min) 53 min   Activity Tolerance Patient tolerated treatment well      Past Medical History:  Diagnosis Date  . Sickle cell trait [facility])     Past Surgical History:  Procedure Laterality Date  . BREAST SURGERY     excision  . CESAREAN SECTION      There were no vitals filed for this visit.      Subjective Assessment - 03/27/16 1610    Subjective Pt reports her back has been feeling better.  She has made changes with how she sits in work chair; this has helped.  She worked out on Saturday (leg strength training) and had minimal increase in symptoms.    Currently in Pain? No/denies   Pain Score 0-No pain            [facility] PT Assessment - 03/27/16 0001      AROM   Lumbar Flexion 85 deg   fingers touch ankles.    Lumbar Extension 27 deg   Lumbar - Right Side Bend 42 deg   Lumbar - Left Side Bend 42 deg    Lumbar - Right Rotation 50 deg   Lumbar - Left Rotation 60           [facility] Adult PT Treatment/Exercise - 03/27/16 0001      Lumbar Exercises: Stretches   Double Knee to Chest Stretch 3 reps;20 seconds   Lower Trunk Rotation 60 seconds  within tolerance   Hip Flexor Stretch 3 reps;30 seconds  knees to chest drop LE off table, then high kneeling      Lumbar Exercises: Aerobic   Stationary Bike NuStep L5: 6 min      Lumbar Exercises: Supine   Clam 10 reps  with 3 part core   Heel Slides 10 reps  with 3 part core   Bent Knee Raise 10 reps  with 3 part  core   Other Supine Lumbar Exercises pilates beginner hundred (only counting to 25 x 3 reps) - feet on mat.   Pilates table top heel taps x 3 reps each leg; stopped due to increased back pain (eased with lumbar stretches)      Lumbar Exercises: Quadruped   Madcat/Old Horse 5 reps   Madcat/Old Horse Limitations (then childs pose with lateral flexion    Opposite Arm/Leg Raise Right arm/Left leg;Left arm/Right leg;5 reps;5 seconds     Moist Heat Therapy   Number Minutes Moist Heat 10 Minutes   Moist Heat Location Lumbar Spine                     PT Long Term Goals - 03/27/16 1618      PT LONG TERM GOAL #1   Title Improve segmental spine mobility with patient to demonstrated spinal mobility at ~ 75% of ROM throughtout trunk 04/25/16   Time 6   Period Weeks   Status On-going  PT LONG TERM GOAL #2   Title Patient to demonstrate good core stability allowing her to participate in stabilization program without pain or difficult 04/25/16   Time 6   Period Weeks   Status On-going     PT LONG TERM GOAL #3   Title Patient reports that she can sit; stand; walk for functional times without increase in symptoms 04/25/16   Time 6   Period Weeks   Status Achieved     PT LONG TERM GOAL #4   Title Independent in HEP 04/25/16   Time 6   Period Weeks   Status On-going     PT LONG TERM GOAL #5   Title Improve FOTO to </=15% limitation 04/25/16   Time 6   Period Weeks   Status On-going               Plan - 03/27/16 1654    Clinical Impression Statement Pt tolerated all exercises well except pilates table top heel taps.  Her pain has reduced and she is tolerating workouts with minimal to no symptoms.  She has met LTG #3 and is making great progress towards remaining goals.    Rehab Potential Good   PT Frequency 1x / week   PT Duration 6 weeks   PT Treatment/Interventions Patient/family education;ADLs/Self Care Home Management;Cryotherapy;Electrical Stimulation;Iontophoresis  67m/ml Dexamethasone;Moist Heat;Traction;Ultrasound;Dry needling;Manual techniques;Therapeutic activities;Therapeutic exercise   PT Next Visit Plan Assess readiness for d/c. Advance HEP as tolerated.    Consulted and Agree with Plan of Care Patient      Patient will benefit from skilled therapeutic intervention in order to improve the following deficits and impairments:  Postural dysfunction, Improper body mechanics, Pain, Decreased range of motion, Decreased mobility, Decreased strength, Decreased activity tolerance, Increased fascial restricitons, Increased muscle spasms  Visit Diagnosis: Acute bilateral low back pain without sciatica  Other symptoms and signs involving the musculoskeletal system     Problem List Patient Active Problem List   Diagnosis Date Noted  . Acute lumbosacral myofascial strain 03/02/2016  . Sickle cell trait ([place] 08/12/2014  . Family history of colon cancer 08/12/2014  . History of abnormal cervical Pap smear 08/12/2014   JKerin Perna PTA 03/27/16 4:57 PM  [facility] [place] [place] [address] [address]Phone: [phone]  Fax:  [phone] Name: Alexandra TENPASMRN: [id number] of Birth: 129-Dec-1977 PHYSICAL THERAPY DISCHARGE SUMMARY  Visits from Start of Care: 3  Current functional level related to goals / functional outcomes: See progress note for discharge status   Remaining deficits: See progress note   Education / Equipment: HEP Plan: Patient agrees to discharge.  Patient goals were not met. Patient is being discharged due to not returning since the last visit.  ?????     Celyn P. HHelene KelpPT, MPH 04/26/16 4:31 PM

## 2016-03-27 NOTE — Patient Instructions (Signed)
  Abdominal Bracing With Pelvic Floor (Hook-Lying)   With neutral spine, tighten pelvic floor and abdominals. Hold 10 seconds. Repeat __10_ times. Do _1__ times a day.   Knee to Chest: Transverse Plane Stability   Tighten abdominals. Bring one knee up, then return. Be sure pelvis does not roll side to side. Keep pelvis still. Lift knee __10_ times each leg. Restabilize pelvis. Repeat with other leg. Do _1-2__ sets, _1__ times per day.   Hip External Rotation With Pillow: Transverse Plane Stability   Tighten abdominals. (KEEP BOTH KNEES BENT)  Slowly roll bent knee out. Be sure pelvis does not rotate. Do _10__ times. Restabilize pelvis. Repeat with other leg. Do _1-2__ sets, _1__ times per day.  Heel Slide: 4-10 Inches - Transverse Plane Stability   Tighten abdominals. Slide heel 4 inches down. Be sure pelvis does not rotate. Do _10__ times. Restabilize pelvis. Repeat with other leg. Do __1_ sets, _1__ times per day.   [facility] [address] [place], [place] [address]  [phone] (office) [phone] (fax)

## 2016-04-03 ENCOUNTER — Encounter: Payer: 59 | Admitting: Rehabilitative and Restorative Service Providers"

## 2016-04-10 ENCOUNTER — Encounter: Payer: 59 | Admitting: Rehabilitative and Restorative Service Providers"

## 2016-07-09 LAB — HM PAP SMEAR: HM PAP: NEGATIVE

## 2016-08-25 DIAGNOSIS — N854 Malposition of uterus: Secondary | ICD-10-CM | POA: Insufficient documentation

## 2016-09-11 ENCOUNTER — Encounter: Payer: Self-pay | Admitting: *Deleted

## 2016-09-11 ENCOUNTER — Emergency Department (INDEPENDENT_AMBULATORY_CARE_PROVIDER_SITE_OTHER)
Admission: EM | Admit: 2016-09-11 | Discharge: 2016-09-11 | Disposition: A | Discharge status: Home / Self Care | Payer: 59 | Source: Home / Self Care | Attending: Family Medicine | Admitting: Family Medicine

## 2016-09-11 ENCOUNTER — Emergency Department (INDEPENDENT_AMBULATORY_CARE_PROVIDER_SITE_OTHER): Payer: 59

## 2016-09-11 DIAGNOSIS — M79671 Pain in right foot: Secondary | ICD-10-CM

## 2016-09-11 MED ORDER — MELOXICAM 15 MG PO TABS: 15.0000 mg | ORAL_TABLET | Freq: Every day | ORAL | 0 refills | Status: DC

## 2016-09-11 NOTE — ED Provider Notes (Signed)
[facility]    CSN: [id number] Arrival date & time: 09/11/16  1534     History   Chief Complaint Chief Complaint  Patient presents with  . Foot Pain    HPI Alexandra Turner is a 41 y.o. female.   Patient complains of developing pain, swelling, and bruising in her right foot that started about 8 days ago.  She recalls no injury or change in activities.   The history is provided by the patient.  Foot Pain  This is a new problem. The current episode started more than 1 week ago. The problem occurs constantly. The problem has not changed since onset.Associated symptoms comments: none. The symptoms are aggravated by walking. Nothing relieves the symptoms. Treatments tried: Ibuprofen. The treatment provided mild relief.    Past Medical History:  Diagnosis Date  . Sickle cell trait [facility](HCC)     Patient Active Problem List   Diagnosis Date Noted  . Acute lumbosacral myofascial strain 03/02/2016  . Sickle cell trait (HCC) 08/12/2014  . Family history of colon cancer 08/12/2014  . History of abnormal cervical Pap smear 08/12/2014    Past Surgical History:  Procedure Laterality Date  . BREAST SURGERY     excision  . CESAREAN SECTION      OB History    No data available       Home Medications    Prior to Admission medications   Medication Sig Start Date End Date Taking? Authorizing Provider  ibuprofen (IBU) 400 MG tablet Take 400 mg by mouth 2 (two) times daily.   Yes [provider]  meloxicam (MOBIC) 15 MG tablet Take 1 tablet (15 mg total) by mouth daily. Take with food each morning 09/11/16   Lattie HawBeese, Jolaine Fryberger A, MD    Family History Family History  Problem Relation Age of Onset  . Cancer Mother   . Cancer Sister     Social History Social History  Substance Use Topics  . Smoking status: Never Smoker  . Smokeless tobacco: Never Used  . Alcohol use 3.0 oz/week    5 Standard drinks or equivalent per week     Allergies   Patient has no  known allergies.   Review of Systems Review of Systems  All other systems reviewed and are negative.    Physical Exam Triage Vital Signs ED Triage Vitals  Enc Vitals Group     BP 09/11/16 1600 125/85     Pulse Rate 09/11/16 1600 (!) 57     Resp 09/11/16 1600 16     Temp 09/11/16 1600 98.6 F (37 C)     Temp Source 09/11/16 1600 Oral     SpO2 09/11/16 1600 100 %     Weight 09/11/16 1601 196 lb (88.9 kg)     Height 09/11/16 1601 5' 8.75" (1.746 m)     Head Circumference --      Peak Flow --      Pain Score 09/11/16 1601 3     Pain Loc --      Pain Edu? --      Excl. in GC? --    No data found.   Updated Vital Signs BP 125/85 (BP Location: Left Arm)   Pulse (!) 57   Temp 98.6 F (37 C) (Oral)   Resp 16   Ht 5' 8.75" (1.746 m)   Wt 196 lb (88.9 kg)   LMP 09/01/2016 Comment: IUD 08/25/2016  SpO2 100%   BMI 29.16 kg/m  Visual Acuity Right Eye Distance:   Left Eye Distance:   Bilateral Distance:    Right Eye Near:   Left Eye Near:    Bilateral Near:     Physical Exam  Constitutional: She appears well-developed and well-nourished. No distress.  HENT:  Head: Normocephalic.  Eyes: Conjunctivae are normal. Pupils are equal, round, and reactive to light.  Cardiovascular: Normal rate.   Pulmonary/Chest: Effort normal.  Musculoskeletal:       Right foot: There is tenderness and bony tenderness.       Feet:  Right foot reveals mild swelling and tenderness between the first and second metatarsals as noted on diagram.   No erythema or warmth.  Distal neurovascular function is intact.   Neurological: She is alert.  Skin: Skin is warm and dry.  Nursing note and vitals reviewed.    UC Treatments / Results  Labs (all labs ordered are listed, but only abnormal results are displayed) Labs Reviewed - No data to display  EKG  EKG Interpretation None       Radiology Dg Foot Complete Right  Result Date: 09/11/2016 CLINICAL DATA:  Right foot pain and swelling  for 8 days without known injury. EXAM: RIGHT FOOT COMPLETE - 3+ VIEW COMPARISON:  None. FINDINGS: There is no evidence of fracture or dislocation. There is no evidence of arthropathy or other focal bone abnormality. Soft tissues are unremarkable. IMPRESSION: Normal right foot. Electronically Signed   By: Lupita Raider, M.D.   On: 09/11/2016 16:11    Procedures Procedures (including critical care time)  Medications Ordered in UC Medications - No data to display   Initial Impression / Assessment and Plan / UC Course  I have reviewed the triage vital signs and the nursing notes.  Pertinent labs & imaging results that were available during my care of the patient were reviewed by me and considered in my medical decision making (see chart for details).    ?Morton's neuroma. Rx Mobic 15mg  daily. Apply ice pack for 15 to 20 minutes, 2 to 3 times daily  Continue until pain and swelling decrease. Followup with Dr. Denyse Amass    Final Clinical Impressions(s) / UC Diagnoses   Final diagnoses:  Right foot pain    New Prescriptions New Prescriptions   MELOXICAM (MOBIC) 15 MG TABLET    Take 1 tablet (15 mg total) by mouth daily. Take with food each morning     Lattie Haw, MD 09/18/16 2206

## 2016-09-11 NOTE — ED Notes (Addendum)
Kim sch'ed appt with Dr Denyse Amassorey for 09/13/2016 3:15pm. Clemens Catholichristy Masao Junker, LPN

## 2016-09-11 NOTE — ED Triage Notes (Signed)
Pt c/o RT foot pain, swelling and bruising x 8 days. Denies injury.

## 2016-09-11 NOTE — Discharge Instructions (Signed)
Apply ice pack for 15 to 20 minutes, 2 to 3 times daily  Continue until pain and swelling decrease.  °

## 2016-09-12 ENCOUNTER — Ambulatory Visit (INDEPENDENT_AMBULATORY_CARE_PROVIDER_SITE_OTHER): Payer: 59 | Admitting: Family Medicine

## 2016-09-12 ENCOUNTER — Encounter: Payer: Self-pay | Admitting: Family Medicine

## 2016-09-12 DIAGNOSIS — M79671 Pain in right foot: Secondary | ICD-10-CM

## 2016-09-12 NOTE — Progress Notes (Signed)
   Alexandra Turner is a 41 y.o. female who [facility] today for right foot pain.   Alexandra Turner notes a several day history of right foot pain and swelling. She has decreased her activity due to recent medical procedures. She notes pain and swelling and has been taking meloxicam which has helped a little. She's not tried any modification to her foot wear. She notes that she is now limping. She denies fevers or chills nausea vomiting or diarrhea.     Past Medical History:  Diagnosis Date  . Sickle cell trait [facility](HCC)    Past Surgical History:  Procedure Laterality Date  . BREAST SURGERY     excision  . CESAREAN SECTION     Social History  Substance Use Topics  . Smoking status: Never Smoker  . Smokeless tobacco: Never Used  . Alcohol use 3.0 oz/week    5 Standard drinks or equivalent per week     ROS:  As above   Medications: Current Outpatient Prescriptions  Medication Sig Dispense Refill  . ibuprofen (IBU) 400 MG tablet Take 400 mg by mouth 2 (two) times daily.    . meloxicam (MOBIC) 15 MG tablet Take 1 tablet (15 mg total) by mouth daily. Take with food each morning 15 tablet 0   No current facility-administered medications for this visit.    No Known Allergies   Exam:  BP 117/64   Pulse 90   Wt 195 lb (88.5 kg)   LMP 09/01/2016 Comment: IUD 08/25/2016  BMI 29.01 kg/m  General: Well Developed, well nourished, and in no acute distress.  Neuro/Psych: Alert and oriented x3, extra-ocular muscles intact, able to move all 4 extremities, sensation grossly intact. Skin: Warm and dry, no rashes noted.  Respiratory: Not using accessory muscles, speaking in full sentences, trachea midline.  Cardiovascular: Pulses palpable, no extremity edema. Abdomen: Does not appear distended. MSK: Right foot swollen and tender at the distal second and third metatarsals. The tenderness is most prominent at the dorsal aspect and less prominent  at the plantar aspect. Normal toe motion. Pulses capillary refill and sensation are intact.    No results found for this or any previous visit (from the past 48 hour(s)). Dg Foot Complete Right  Result Date: 09/11/2016 CLINICAL DATA:  Right foot pain and swelling for 8 days without known injury. EXAM: RIGHT FOOT COMPLETE - 3+ VIEW COMPARISON:  None. FINDINGS: There is no evidence of fracture or dislocation. There is no evidence of arthropathy or other focal bone abnormality. Soft tissues are unremarkable. IMPRESSION: Normal right foot. Electronically Signed   By: Lupita RaiderJames  Green Jr, M.D.   On: 09/11/2016 16:11      Assessment and Plan: 41 y.o. female with  right foot pain likely early metatarsal stress fracture/stress reaction. Morton's neuromas a possibility but less likely given lack of paresthesias. Plan for postoperative shoe and relative rest. Recheck in about 3 weeks.    No orders of the defined types were placed in this encounter.  No orders of the defined types were placed in this encounter.   Discussed warning signs or symptoms. Please see discharge instructions. Patient expresses understanding.

## 2016-09-12 NOTE — Patient Instructions (Signed)
Thank you for coming in today. I am concerned for a metatarsal stress fracture.  Use the post op shoe for a few weeks.  If you feel better transition to sneakers with a turf toe plate   Recheck in 3 weeks or so.  Use mobic only as needed for pain.  Take VitD and calcium.  Buy vitamin D over the counter 5000 units daily. Amazon has good price.  Take about 1000mg  calcium twice daily.      Get a Steel Turf Toe insole.  Do a Microbiologistgoogle Search for [employer]urf Toe Full Steel Insoles.

## 2016-09-13 ENCOUNTER — Ambulatory Visit: Payer: 59 | Admitting: Family Medicine

## 2017-07-25 ENCOUNTER — Ambulatory Visit (INDEPENDENT_AMBULATORY_CARE_PROVIDER_SITE_OTHER): Payer: 59 | Admitting: Physician Assistant

## 2017-07-25 ENCOUNTER — Encounter: Payer: Self-pay | Admitting: Physician Assistant

## 2017-07-25 VITALS — BP 119/70 | HR 73 | Resp 16 | Ht 69.0 in | Wt 203.0 lb

## 2017-07-25 DIAGNOSIS — Z800 Family history of malignant neoplasm of digestive organs: Secondary | ICD-10-CM | POA: Diagnosis not present

## 2017-07-25 DIAGNOSIS — F17290 Nicotine dependence, other tobacco product, uncomplicated: Secondary | ICD-10-CM | POA: Diagnosis not present

## 2017-07-25 DIAGNOSIS — R0782 Intercostal pain: Secondary | ICD-10-CM

## 2017-07-25 DIAGNOSIS — Z7689 Persons encountering health services in other specified circumstances: Secondary | ICD-10-CM

## 2017-07-25 DIAGNOSIS — R87810 Cervical high risk human papillomavirus (HPV) DNA test positive: Secondary | ICD-10-CM | POA: Insufficient documentation

## 2017-07-25 DIAGNOSIS — N882 Stricture and stenosis of cervix uteri: Secondary | ICD-10-CM | POA: Insufficient documentation

## 2017-07-25 DIAGNOSIS — R109 Unspecified abdominal pain: Secondary | ICD-10-CM | POA: Diagnosis not present

## 2017-07-25 DIAGNOSIS — Z87891 Personal history of nicotine dependence: Secondary | ICD-10-CM | POA: Diagnosis not present

## 2017-07-25 DIAGNOSIS — N946 Dysmenorrhea, unspecified: Secondary | ICD-10-CM | POA: Insufficient documentation

## 2017-07-25 DIAGNOSIS — Z1329 Encounter for screening for other suspected endocrine disorder: Secondary | ICD-10-CM

## 2017-07-25 DIAGNOSIS — Z862 Personal history of diseases of the blood and blood-forming organs and certain disorders involving the immune mechanism: Secondary | ICD-10-CM | POA: Diagnosis not present

## 2017-07-25 DIAGNOSIS — E559 Vitamin D deficiency, unspecified: Secondary | ICD-10-CM | POA: Diagnosis not present

## 2017-07-25 DIAGNOSIS — F17200 Nicotine dependence, unspecified, uncomplicated: Secondary | ICD-10-CM | POA: Insufficient documentation

## 2017-07-25 DIAGNOSIS — Z1231 Encounter for screening mammogram for malignant neoplasm of breast: Secondary | ICD-10-CM | POA: Diagnosis not present

## 2017-07-25 DIAGNOSIS — F419 Anxiety disorder, unspecified: Secondary | ICD-10-CM | POA: Insufficient documentation

## 2017-07-25 LAB — POCT URINALYSIS DIPSTICK
Bilirubin, UA: NEGATIVE
Glucose, UA: NEGATIVE
KETONES UA: NEGATIVE
NITRITE UA: NEGATIVE
PH UA: 5.5 (ref 5.0–8.0)
PROTEIN UA: NEGATIVE
RBC UA: NEGATIVE
SPEC GRAV UA: 1.02 (ref 1.010–1.025)
UROBILINOGEN UA: 0.2 U/dL

## 2017-07-25 MED ORDER — NAPROXEN 500 MG PO TABS: 500.0000 mg | ORAL_TABLET | Freq: Two times a day (BID) | ORAL | 0 refills | Status: DC

## 2017-07-25 NOTE — Progress Notes (Signed)
HPI:                                                                Alexandra Turner is a 42 y.o. female who [facility] today to establish care  Current concerns include: left side pain  Reports left flank/rib pain beginning approx 4 months ago in December. The pain resolved on its own after 4 weeks, but then recurred and has been gradually worsening. Pain is moderate, persistent. Originally worse with lying flat and worse first thing in the morning. Pain wakes her up at night. Pain dissipated throughout the day. Pain fluctuates in severity and character, pulsating and sharp at its most severe and a dull ache the remainder of the time. It is not reproducible. It is not aggravated by any physical activity. Does not radiate.No known injury or trauma. Negative CXR on 06/2017. Not associated with constitutional symptoms, dyspnea, cough, nausea/vomiting, change in bowel or bladder habits. No history of kidney stones or pyelonephritis Tried changing her mattress, reducing physical activity.  Ibuprofen helps but wears off after about 4 hours  Quit smoking cigarettes 5 years ago, 3/4-1 ppd x 15 years Quit cold Mal[place]key method during pregnancy     No flowsheet data found.  No flowsheet data found.    Past Medical History:  Diagnosis Date  . Depression   . Sickle cell trait (HCC)   . Vitamin D deficiency    Past Surgical History:  Procedure Laterality Date  . BREAST SURGERY     excision of benign mass, unknown laterality  . CESAREAN SECTION    . INTRAUTERINE DEVICE (IUD) INSERTION     Social History   Tobacco Use  . Smoking status: Never Smoker  . Smokeless tobacco: Never Used  Substance Use Topics  . Alcohol use: Yes    Alcohol/week: 8.4 oz    Types: 14 Standard drinks or equivalent per week   family history includes Cancer in her brother; Colon cancer in her mother; Hodgkin's lymphoma in her sister; Hypertension in her  mother; Kidney disease in her brother; Stroke in her maternal grandfather.    ROS: Review of Systems  Respiratory: Positive for shortness of breath. Negative for cough, hemoptysis, sputum production and wheezing.   Cardiovascular: Positive for chest pain (left intercostal). Negative for palpitations, orthopnea, claudication and leg swelling.  Gastrointestinal: Negative for abdominal pain, blood in stool, constipation, diarrhea, heartburn, melena, nausea and vomiting.  Genitourinary: Positive for flank pain. Negative for dysuria, frequency, hematuria and urgency.  All other systems reviewed and are negative.    Medications: Current Outpatient Medications  Medication Sig Dispense Refill  . levonorgestrel (MIRENA, 52 MG,) 20 MCG/24HR IUD Mirena 20 mcg/24 hours (5 yrs) 52 mg intrauterine device    . Ergocalciferol 2500 units CAPS Take 5,000 Units by mouth daily. 30 capsule   . naproxen (NAPROSYN) 500 MG tablet Take 1 tablet (500 mg total) by mouth 2 (two) times daily with a meal. 30 tablet 0   No current facility-administered medications for this visit.    No Known Allergies     Objective:  BP 119/70   Pulse 73   Resp 16   Ht 5\' 9"  (1.753 m)   Wt 203 lb (92.1  kg)   LMP 04/16/2017 (Exact Date)   SpO2 98%   BMI 29.98 kg/m  Gen:  alert, not ill-appearing, no distress, appropriate for age HEENT: head normocephalic without obvious abnormality, conjunctiva and cornea clear, trachea midline Pulm: Normal work of breathing, normal phonation, clear to auscultation bilaterally, no wheezes, rales or rhonchi CV: Normal rate, regular rhythm, s1 and s2 distinct, no murmurs, clicks or rubs  Neuro: alert and oriented x 3, no tremor MSK: extremities atraumatic, normal gait and station Skin: intact, no rashes on exposed skin, no jaundice, no cyanosis Psych: well-groomed, cooperative, good eye contact, euthymic mood, affect mood-congruent, speech is articulate, and thought processes clear and  goal-directed  XR Chest Pa And Lateral3/13/2019 [facility] Result Narrative  TECHNIQUE: XR CHEST PA AND LATERAL Over read interpretation provided.Primary interpretation provided by on-site provider referenced in electronic medical record at time of over read.  INDICATION: left rib pain.  COMPARISON: N/A  PRIMARY INTERPRETATION: Results viewed in EMR/EPIC: No rib fractures, infiltrates, pleural effusion or pneumothorax Primary read performed by LINDA L HABLITZ     No results found for this or any previous visit (from the past 72 hour(s)). No results found.    Assessment and Plan: 42 y.o. female with   1. Encounter to establish care - Personally reviewed PMH, PSH, PFH, medications, allergies, HM - Age-appropriate cancer screening: Pap UTD per patient, requesting records Dr. Shawnie Pons - Influenza n/a - Tdap UTD    2. Other tobacco product nicotine dependence, uncomplicated   3. Family history of colon cancer requiring screening colonoscopy - Ambulatory referral to Gastroenterology  4. Breast cancer screening by mammogram - MM SCREENING BREAST TOMO BILATERAL; Future  5. Acute left flank pain /Intercostal pain - differential diagnosis is broad including pathology of the lung, bowel, kidney and pancreas. It has a musculoskeletal quality in that it is positional and relieved temporarilty with NSAIDs.Given duration of pain > 6 weeks, pain waking her from sleep, and smoking history I am going to expand the work-up today to include labs and Chest CT. - equivocal CXR results in Care Everywhere and unable to view images. Requesting records from NH.  - symptomatic care with Naprosyn and heat; avoid aggravating activities. Labs and urine culture. Follow-up in 2 weeks - CBC with Differential/Platelet - Comprehensive metabolic panel - Lipase - POCT Urinalysis Dipstick positive for small leuks - Urine Culture pending  6. Screening for thyroid disorder - TSH + free T4  7.  History of anemia - CBC with Differential/Platelet - Iron, TIBC and Ferritin Panel  8. Vitamin D deficiency - VITAMIN D 25 Hydroxy (Vit-D Deficiency, Fractures)  9. Former smoker  10. Intercostal pain - CT Chest Wo Contrast; Future     Patient education and anticipatory guidance given Patient agrees with treatment plan Follow-up in 2 weeks or sooner as needed if symptoms worsen or fail to improve  Levonne Hubert PA-C

## 2017-07-26 LAB — CBC WITH DIFFERENTIAL/PLATELET
BASOS PCT: 0.4 %
Basophils Absolute: 19 cells/uL (ref 0–200)
EOS PCT: 4.9 %
Eosinophils Absolute: 230 cells/uL (ref 15–500)
HCT: 34.8 % — ABNORMAL LOW (ref 35.0–45.0)
Hemoglobin: 11.8 g/dL (ref 11.7–15.5)
LYMPHS ABS: 1777 {cells}/uL (ref 850–3900)
MCH: 28.7 pg (ref 27.0–33.0)
MCHC: 33.9 g/dL (ref 32.0–36.0)
MCV: 84.7 fL (ref 80.0–100.0)
MPV: 10.9 fL (ref 7.5–12.5)
Monocytes Relative: 9.3 %
NEUTROS PCT: 47.6 %
Neutro Abs: 2237 cells/uL (ref 1500–7800)
PLATELETS: 306 10*3/uL (ref 140–400)
RBC: 4.11 10*6/uL (ref 3.80–5.10)
RDW: 13.2 % (ref 11.0–15.0)
TOTAL LYMPHOCYTE: 37.8 %
WBC: 4.7 10*3/uL (ref 3.8–10.8)
WBCMIX: 437 {cells}/uL (ref 200–950)

## 2017-07-26 LAB — LIPASE: Lipase: <5 U/L — ABNORMAL LOW (ref 7–60)

## 2017-07-26 LAB — COMPREHENSIVE METABOLIC PANEL
AG Ratio: 1.6 (calc) (ref 1.0–2.5)
ALBUMIN MSPROF: 4.2 g/dL (ref 3.6–5.1)
ALKALINE PHOSPHATASE (APISO): 55 U/L (ref 33–115)
ALT: 10 U/L (ref 6–29)
AST: 14 U/L (ref 10–30)
BUN: 7 mg/dL (ref 7–25)
CHLORIDE: 104 mmol/L (ref 98–110)
CO2: 27 mmol/L (ref 20–32)
CREATININE: 0.68 mg/dL (ref 0.50–1.10)
Calcium: 9.4 mg/dL (ref 8.6–10.2)
GLOBULIN: 2.7 g/dL (ref 1.9–3.7)
Glucose, Bld: 89 mg/dL (ref 65–99)
POTASSIUM: 3.8 mmol/L (ref 3.5–5.3)
Sodium: 140 mmol/L (ref 135–146)
TOTAL PROTEIN: 6.9 g/dL (ref 6.1–8.1)
Total Bilirubin: 0.5 mg/dL (ref 0.2–1.2)

## 2017-07-26 LAB — IRON,TIBC AND FERRITIN PANEL
%SAT: 30 % (ref 11–50)
Ferritin: 50 ng/mL (ref 10–232)
IRON: 99 ug/dL (ref 40–190)
TIBC: 334 mcg/dL (calc) (ref 250–450)

## 2017-07-26 LAB — TSH+FREE T4: TSH W/REFLEX TO FT4: 0.96 m[IU]/L

## 2017-07-26 LAB — URINE CULTURE
MICRO NUMBER: 90413417
RESULT: NO GROWTH
SPECIMEN QUALITY: ADEQUATE

## 2017-07-26 LAB — VITAMIN D 25 HYDROXY (VIT D DEFICIENCY, FRACTURES): VIT D 25 HYDROXY: 15 ng/mL — AB (ref 30–100)

## 2017-07-27 ENCOUNTER — Other Ambulatory Visit: Payer: Self-pay | Admitting: Physician Assistant

## 2017-07-27 DIAGNOSIS — E559 Vitamin D deficiency, unspecified: Secondary | ICD-10-CM

## 2017-07-27 MED ORDER — ERGOCALCIFEROL 62.5 MCG (2500 UT) PO CAPS: 5000.0000 [IU] | ORAL_CAPSULE | Freq: Every day | ORAL | Status: DC

## 2017-07-27 NOTE — Progress Notes (Signed)
Labs look good - vitamin D is very low - recommend D3 5000 units daily - no anemia - normal thyroid function

## 2017-08-03 ENCOUNTER — Telehealth: Payer: Self-pay

## 2017-08-03 NOTE — Telephone Encounter (Signed)
Pt called requesting update on the status of her CT. Pt reports that she is still in pain and is interested in scheduling this soon.   Per notes, it appears that CT was denied.   Are you going to do peer-to-peer for this?  Please advise and let me know what I can tell patient.  Thanks!

## 2017-08-05 ENCOUNTER — Encounter: Payer: Self-pay | Admitting: Physician Assistant

## 2017-08-05 DIAGNOSIS — Z87891 Personal history of nicotine dependence: Secondary | ICD-10-CM | POA: Insufficient documentation

## 2017-08-05 DIAGNOSIS — R0782 Intercostal pain: Secondary | ICD-10-CM | POA: Insufficient documentation

## 2017-08-05 DIAGNOSIS — Z862 Personal history of diseases of the blood and blood-forming organs and certain disorders involving the immune mechanism: Secondary | ICD-10-CM | POA: Insufficient documentation

## 2017-08-05 DIAGNOSIS — E559 Vitamin D deficiency, unspecified: Secondary | ICD-10-CM | POA: Insufficient documentation

## 2017-08-05 NOTE — Telephone Encounter (Signed)
Thanks, Fleet ContrasRachel! I am going to work on the peer-to-peer Monday Can you do me 2 favors? 1. Can you contact [facility] and see if we can get both the report and copy of the images for her CXR done in March?  2. Can we get the patient in for a follow-up appt. This week?

## 2017-08-06 ENCOUNTER — Encounter: Payer: Self-pay | Admitting: Physician Assistant

## 2017-08-06 NOTE — Telephone Encounter (Signed)
Spoke to patient, advised her that I would contact [facility] [place] to obtain report of CXR. Pt made aware that she would need to contact [facility] to obtain the disk of the actual images, states she understand and will obtain disk prior to f/u appt and bring it with her.   Pt stated that she was on her way to work and unable to schedule now, but that she will call back to set up f/u appt.

## 2017-08-06 NOTE — Telephone Encounter (Signed)
[facility] to provide additional clinical information Awaiting response, 2 business days

## 2017-08-08 ENCOUNTER — Encounter: Payer: Self-pay | Admitting: Physician Assistant

## 2017-08-08 ENCOUNTER — Ambulatory Visit (INDEPENDENT_AMBULATORY_CARE_PROVIDER_SITE_OTHER): Payer: 59 | Admitting: Physician Assistant

## 2017-08-08 VITALS — BP 94/61 | HR 75 | Ht 69.0 in | Wt 199.5 lb

## 2017-08-08 DIAGNOSIS — R0782 Intercostal pain: Secondary | ICD-10-CM

## 2017-08-08 MED ORDER — CYCLOBENZAPRINE HCL 5 MG PO TABS: 5.0000 mg | ORAL_TABLET | Freq: Every evening | ORAL | 1 refills | Status: DC | PRN

## 2017-08-08 MED ORDER — PREDNISONE 20 MG PO TABS: 40.0000 mg | ORAL_TABLET | Freq: Every day | ORAL | 0 refills | Status: AC

## 2017-08-08 MED ORDER — OMEPRAZOLE 20 MG PO CPDR: 20.0000 mg | DELAYED_RELEASE_CAPSULE | Freq: Every day | ORAL | 0 refills | Status: DC

## 2017-08-08 NOTE — Progress Notes (Signed)
HPI:                                                                Alexandra Turner is a 42 y.o. female who presents to [name]: Primary Care Sports Medicine today for left rib/flank pain  This is a pleasant 42 yo F with PMH of sickle cell trait, back pain/myofascial strain, dysmenorrhea, anxiety, former smoker who presents with persistent left flank/rib pain beginning approx 4 months ago in December.   Interval history: reports symptoms are waxing and waning. She is still having pain with shoulder flexion and twisting/internal rotation. She cannot sleep on her left side at all. Has had some pain waking her from sleep.   Pain fluctuates in severity and character, pulsating and sharp at its most severe and a dull ache the remainder of the time. It is not reproducible. It is not aggravated by any physical activity. Does not radiate.No known injury or trauma. Negative CXR on 06/2017. Not associated with constitutional symptoms, dyspnea, cough, nausea/vomiting, change in bowel or bladder habits. No history of kidney stones or pyelonephritis Tried changing her mattress, reducing physical activity.  Ibuprofen helps but wears off after about 4 hours  Quit smoking cigarettes 5 years ago, 3/4-1 ppd x 15 years Quit cold [place] method during pregnancy   No flowsheet data found.  No flowsheet data found.    Past Medical History:  Diagnosis Date  . Depression   . Sickle cell trait (HCC)   . Vitamin D deficiency    Past Surgical History:  Procedure Laterality Date  . BREAST SURGERY     excision of benign mass, unknown laterality  . CESAREAN SECTION    . INTRAUTERINE DEVICE (IUD) INSERTION     Social History   Tobacco Use  . Smoking status: Former Smoker    Packs/day: 0.75    Years: 15.00    Pack years: 11.25    Types: Cigarettes    Last attempt to quit: 03/22/2013    Years since quitting: 4.3  . Smokeless tobacco: Never Used  Substance Use Topics  . Alcohol  use: Yes    Alcohol/week: 8.4 oz    Types: 14 Standard drinks or equivalent per week   family history includes Cancer in her brother; Colon cancer in her mother; Hodgkin's lymphoma in her sister; Hypertension in her mother; Kidney disease in her brother; Stroke in her maternal grandfather.    ROS: negative except as noted in the HPI  Medications: Current Outpatient Medications  Medication Sig Dispense Refill  . Ergocalciferol 2500 units CAPS Take 5,000 Units by mouth daily. 30 capsule   . levonorgestrel (MIRENA, 52 MG,) 20 MCG/24HR IUD Mirena 20 mcg/24 hours (5 yrs) 52 mg intrauterine device    . naproxen (NAPROSYN) 500 MG tablet Take 1 tablet (500 mg total) by mouth 2 (two) times daily with a meal. 30 tablet 0   No current facility-administered medications for this visit.    No Known Allergies     Objective:  BP 94/61   Pulse 75   Ht 5\' 9"  (1.753 m)   Wt 199 lb 8 oz (90.5 kg)   SpO2 99%   BMI 29.46 kg/m  Gen:  alert, not ill-appearing, no distress, appropriate for age HEENT:  head normocephalic without obvious abnormality, conjunctiva and cornea clear, trachea midline Pulm: Normal work of breathing, normal phonation Neuro: alert and oriented x 3, no tremor MSK: extremities atraumatic, normal gait and station Skin: intact, no rashes on exposed skin, no jaundice, no cyanosis Psych: well-groomed, cooperative, good eye contact, euthymic mood, affect mood-congruent, speech is articulate, and thought processes clear and goal-directed    No results found for this or any previous visit (from the past 72 hour(s)). No results found.    Assessment and Plan: 42 y.o. female with   1. Intercostal pain - patient was evaluated by Dr. Denyse Amassorey, Sports Medicine, today. Dr. Denyse Amassorey feels pain is likely musculoskeletal and patient would benefit from formal PT. He also recommends advanced imaging given persistence of symptoms >3 months.   Intercostal pain - Plan: Ambulatory referral to  Physical Therapy, predniSONE (DELTASONE) 20 MG tablet, cyclobenzaprine (FLEXERIL) 5 MG tablet, omeprazole (PRILOSEC) 20 MG capsule       Patient education and anticipatory guidance given Patient agrees with treatment plan Follow-up as needed if symptoms worsen or fail to improve  Levonne Hubertharley E. Cummings PA-C

## 2017-08-10 ENCOUNTER — Encounter: Payer: Self-pay | Admitting: Physician Assistant

## 2017-08-15 ENCOUNTER — Ambulatory Visit: Payer: 59 | Admitting: Physician Assistant

## 2017-08-17 ENCOUNTER — Encounter: Payer: Self-pay | Admitting: Rehabilitative and Restorative Service Providers"

## 2017-08-17 ENCOUNTER — Ambulatory Visit: Payer: 59 | Admitting: Rehabilitative and Restorative Service Providers"

## 2017-08-17 DIAGNOSIS — M546 Pain in thoracic spine: Secondary | ICD-10-CM | POA: Diagnosis not present

## 2017-08-17 DIAGNOSIS — R29898 Other symptoms and signs involving the musculoskeletal system: Secondary | ICD-10-CM

## 2017-08-17 NOTE — Therapy (Signed)
[facility] [address] [place], [place], [address] Phone: [phone]   Fax:  [phone]  Physical Therapy Treatment  Patient Details  Name: Alexandra Turner MRN: [id number] Date of Birth: 01-30-1976 Referring Provider: Gena Fray PA-C    Encounter Date: 08/17/2017  PT End of Session - 08/17/17 1652    Visit Number  1    Number of Visits  6    Date for PT Re-Evaluation  09/28/17    PT Start Time  1447    PT Stop Time  1549    PT Time Calculation (min)  62 min    Activity Tolerance  Patient tolerated treatment well       Past Medical History:  Diagnosis Date  . Depression   . Sickle cell trait (HCC)   . Vitamin D deficiency     Past Surgical History:  Procedure Laterality Date  . BREAST SURGERY     excision of benign mass, unknown laterality  . CESAREAN SECTION    . INTRAUTERINE DEVICE (IUD) INSERTION      There were no vitals filed for this visit.  Subjective Assessment - 08/17/17 1452    Subjective  Patient reports that she has Turner in the Lt side for the past 8 weeks with no known cause of injury. Some improvement iwth 5 days of prednisone which stopped 08/13/17 with gradual incresae on symptoms.     Pertinent History  denies prior musculoskeletal injuries or problems     Diagnostic tests  xrays (-)    Patient Stated Goals  get rid of the Turner     Currently in Turner?  Yes    Turner Score  5     Turner Location  Rib cage    Turner Orientation  Left    Turner Descriptors / Indicators  Sharp;Dull;Burning;Aching    Turner Type  Acute Turner    Turner Radiating Towards  sometimes into the midback     Turner Onset  More than a month ago    Turner Frequency  Intermittent    Aggravating Factors   lying on the Lt side; worse eary morning; reaching down toward Lt knee; pressure sitting in chair with back resting on the chair     Turner Relieving Factors  meds         [facility] PT Assessment - 08/17/17 0001      Assessment    Medical Diagnosis  Lt intercostal Turner     Referring Provider  Gena Fray PA-C     Onset Date/Surgical Date  06/08/17    Hand Dominance  Right    Next MD Visit  09/15/17    Prior Therapy  none       Precautions   Precautions  None      Balance Screen   Has the patient fallen in the past 6 months  No    Has the patient had a decrease in activity level because of a fear of falling?   No    Is the patient reluctant to leave their home because of a fear of falling?   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     Leisure  household chores; gym 4 days/wk cardio; wt training with machines and free weights  Sensation   Additional Comments  WFL's per pt report       Posture/Postural Control   Posture Comments  head forward shoulders rounded and elevated       AROM   Right/Left Hip  -- WNL's bilat hips     Lumbar Flexion  80% Turner Lt side upon return to stand     Lumbar Extension  50%    Lumbar - Right Side Bend  60% minimal pull Lt side     Lumbar - Left Side Bend  50% discomfort Lt side     Lumbar - Right Rotation  40%    Lumbar - Left Rotation  35% discomfort Lt side       Strength   Overall Strength Comments  5/5 bilat LE's       Flexibility   Soft Tissue Assessment /Muscle Length  -- LE flexibility WFL's bilat      Palpation   Spinal mobility  WFL's thoracic and lumbar CPA and lateral mobs - no Turner     Palpation comment  no Turner with spring testing through the ribs bilat in into the sternum with the exception of lower Lt lateral ribs - discomfort with direct pressure through the ribs - there is tenderness in intercostal space of ribs 10 and 11  - tightness through the transfer abdominal and lat junction Lt                    [facility] Adult PT Treatment/Exercise - 08/17/17 0001      Lumbar Exercises: Stretches   Double Knee  to Chest Stretch  3 reps;20 seconds    Other Lumbar Stretch Exercise  trial of lat stretch with minimal to no stretch     Other Lumbar Stretch Exercise  lateral flexion with stretch for thoracic spine 30 sec x 2 to Rt to stretch Lt       Lumbar Exercises: Quadruped   Madcat/Old Horse  5 reps    Other Quadruped Lumbar Exercises  child's pose x 2 reps x 30 sec hold       Moist Heat Therapy   Number Minutes Moist Heat  20 Minutes    Moist Heat Location  Lumbar Spine Lt thoracic/rib area       Electrical Stimulation   Electrical Stimulation Location  Lt lateral lower ribs    Electrical Stimulation Action  IFC    Electrical Stimulation Parameters  to tolerance    Electrical Stimulation Goals  Turner;Tone      Iontophoresis   Type of Iontophoresis  Dexamethasone    Location  Lt lateral inferior ribs    Dose  120 mAmp    Time  10-12 hours              PT Education - 08/17/17 1528    Education provided  Yes    Education Details  HEP ionto    Person(s) Educated  Patient    Methods  Explanation;Demonstration;Tactile cues;Verbal cues;Handout    Comprehension  Verbalized understanding;Returned demonstration;Verbal cues required;Tactile cues required          PT Long Term Goals - 08/17/17 1657      PT LONG TERM GOAL #1   Title  Improve segmental spine mobility with patient to demonstrated full Turner free ROM throughtout trunk 09/28/17    Time  6    Period  Weeks    Status  New      PT LONG TERM GOAL #2  Title  Patient to demonstrate good core stability allowing her to participate in stabilization program without Turner or difficult 09/28/17    Time  6    Period  Weeks    Status  New      PT LONG TERM GOAL #3   Title  Patient reports that she can sit; stand; walk for functional times without increase in symptoms 09/28/17    Time  6    Period  Weeks    Status  New      PT LONG TERM GOAL #4   Title  Independent in HEP 09/28/17    Time  6    Period  Weeks    Status  New             Plan - 08/17/17 1652    Clinical Impression Statement  Patient presents with 8 week history of Lt side Turner of unknown etiology. She has Turner with functional activities including sitting with pressure at back; turning over in bed; lying on the Lt side. She has tenderness to palpation through the lower left ribs with spring testing and direct palpation as well as pull and discomfort with various stretches. Patient will benefit form PT to address problems identified.     History and Personal Factors relevant to plan of care:  history of LBP managed with exercise and TENS unit at home following PT 12/17     Clinical Presentation  Stable    Clinical Decision Making  Low    Rehab Potential  Good    PT Frequency  1x / week    PT Duration  6 weeks    PT Treatment/Interventions  Patient/family education;ADLs/Self Care Home Management;Cryotherapy;Electrical Stimulation;Iontophoresis 4mg /ml Dexamethasone;Moist Heat;Ultrasound;Dry needling;Manual techniques;Neuromuscular re-education;Therapeutic activities;Therapeutic exercise    PT Next Visit Plan  review HEP; assess response to ionto and modalities; manual work through the [employer] lower rib/side area; modalities as indicated     Consulted and Agree with Plan of Care  Patient       Patient will benefit from skilled therapeutic intervention in order to improve the following deficits and impairments:  Postural dysfunction, Improper body mechanics, Increased fascial restricitons, Increased muscle spasms, Hypomobility, Decreased range of motion, Decreased activity tolerance  Visit Diagnosis: Turner in thoracic spine - Plan: PT plan of care cert/re-cert  Other symptoms and signs involving the musculoskeletal system - Plan: PT plan of care cert/re-cert     Problem List Patient Active Problem List   Diagnosis Date Noted  . History of anemia 08/05/2017  . Vitamin D deficiency 08/05/2017  . Former smoker 08/05/2017  . 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 Cervical high risk HPV (human papillomavirus) test positive 07/25/2017  . Anxiety 07/25/2017  . Dysmenorrhea 07/25/2017  . Stenosis of cervix 07/25/2017  . Nicotine dependence 07/25/2017  . 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 Family history of colon cancer 08/12/2014  . History of abnormal cervical Pap smear 08/12/2014  . Sickle cell trait (HCC) 09/28/2012  . Acne 09/28/2012    Shritha Bresee Rober Minion PT, MPH  08/17/2017, 5:01 PM  [facility] [address] [place], [place], [address] Phone: [phone]   Fax:  [phone]  Name: LASHANNON BRESNAN MRN: [id number] Date of Birth: 1975/09/14

## 2017-08-17 NOTE — Patient Instructions (Addendum)
Cat / Cow Flow    Inhale, press spine toward ceiling like a Halloween cat. Keeping strength in arms and abdominals, exhale to soften spine through neutral and into cow pose. Open chest and arch back. Initiate movement between cat and cow at tailbone, one vertebrae at a time. Repeat __2-5__ times.   BACK: Child's Pose (Sciatica)    Sit in knee-chest position and reach arms forward. Separate knees for comfort. Hold position for 20-30 sec . Repeat _2-3__ times. Do _1-2__ times per day.  Double Knee to Chest (Flexion)    Gently pull both knees toward chest. Feel stretch in lower back or buttock area. Breathing deeply, Hold __10-20__ seconds. Repeat __2-3__ times. Do __1-2__ sessions per day.      Side Bend, Sitting    Sit with feet on floor. Bend to one side, touching elbow to floor. Hold _20__ seconds. To increase stretch, raise other arm above head.  Repeat _2__ times per session. Do _2__ sessions per day.  IONTOPHORESIS PATIENT PRECAUTIONS & CONTRAINDICATIONS:  . Redness under one or both electrodes can occur.  This characterized by a uniform redness that usually disappears within 12 hours of treatment. . Small pinhead size blisters may result in response to the drug.  Contact your physician if the problem persists more than 24 hours. . On rare occasions, iontophoresis therapy can result in temporary skin reactions such as rash, inflammation, irritation or burns.  The skin reactions may be the result of individual sensitivity to the ionic solution used, the condition of the skin at the start of treatment, reaction to the materials in the electrodes, allergies or sensitivity to dexamethasone, or a poor connection between the patch and your skin.  Discontinue using iontophoresis if you have any of these reactions and report to your therapist. . Remove the Patch or electrodes if you have any undue sensation of pain or burning during the treatment and report discomfort to your  therapist. . Tell your Therapist if you have had known adverse reactions to the application of electrical current. . If using the Patch, the LED light will turn off when treatment is complete and the patch can be removed.  Approximate treatment time is 1-3 hours.  Remove the patch when light goes off or after 6 hours. . The Patch can be worn during normal activity, however excessive motion where the electrodes have been placed can cause poor contact between the skin and the electrode or uneven electrical current resulting in greater risk of skin irritation. Marland Kitchen. Keep out of the reach of children.   . DO NOT use if you have a cardiac pacemaker or any other electrically sensitive implanted device. . DO NOT use if you have a known sensitivity to dexamethasone. . DO NOT use during Magnetic Resonance Imaging (MRI). . DO NOT use over broken or compromised skin (e.g. sunburn, cuts, or acne) due to the increased risk of skin reaction. . DO NOT SHAVE over the area to be treated:  To establish good contact between the Patch and the skin, excessive hair may be clipped. . DO NOT place the Patch or electrodes on or over your eyes, directly over your heart, or brain. . DO NOT reuse the Patch or electrodes as this may cause burns to occur.

## 2017-08-23 ENCOUNTER — Ambulatory Visit: Payer: 59 | Admitting: Rehabilitative and Restorative Service Providers"

## 2017-08-23 ENCOUNTER — Encounter: Payer: Self-pay | Admitting: Rehabilitative and Restorative Service Providers"

## 2017-08-23 DIAGNOSIS — R29898 Other symptoms and signs involving the musculoskeletal system: Secondary | ICD-10-CM | POA: Diagnosis not present

## 2017-08-23 DIAGNOSIS — M545 Low back pain, unspecified: Secondary | ICD-10-CM

## 2017-08-23 DIAGNOSIS — M546 Pain in thoracic spine: Secondary | ICD-10-CM

## 2017-08-23 NOTE — Patient Instructions (Signed)
Rt sidelying for elongation of Lt trunk pushing left hip down Protraction and retraction of Lt hip   Videoed exercises on patient's phone for home

## 2017-08-23 NOTE — Therapy (Signed)
[facility] [place] [address] [place], [place], [address] Phone: [phone]   Fax:  [phone]  Physical Therapy Treatment  Patient Details  Name: Alexandra Turner MRN: [id number] Date of Birth: [birth year] Referring Provider: Gena Fray PA-C    Encounter Date: 08/23/2017  PT End of Session - 08/23/17 1449    Visit Number  2    Number of Visits  6    Date for PT Re-Evaluation  09/28/17    PT Start Time  1445    PT Stop Time  1543    PT Time Calculation (min)  58 min    Activity Tolerance  Patient tolerated treatment well       Past Medical History:  Diagnosis Date  . Depression   . Sickle cell trait (HCC)   . Vitamin D deficiency     Past Surgical History:  Procedure Laterality Date  . BREAST SURGERY     excision of benign mass, unknown laterality  . CESAREAN SECTION    . INTRAUTERINE DEVICE (IUD) INSERTION      There were no vitals filed for this visit.  Subjective Assessment - 08/23/17 1450    Subjective  Patient reports that she is sore - different from the pain that she was having. She is sleeping better. The pain is less intense - notices the soreness more on the surface - not as much discomfort and pain "inside". Can see that she is making some progress.     Currently in Pain?  Yes    Pain Score  7     Pain Location  Rib cage    Pain Orientation  Left    Pain Descriptors / Indicators  Dull;Aching;Sore    Pain Type  Acute pain    Pain Onset  More than a month ago    Pain Frequency  Constant                       [facility] Adult PT Treatment/Exercise - 08/23/17 0001      Lumbar Exercises: Stretches   Double Knee to Chest Stretch  3 reps;20 seconds    Other Lumbar Stretch Exercise  lateral flexion with stretch for thoracic spine 30 sec x 2 to Rt to stretch Lt       Lumbar Exercises: Prone   Other Prone Lumbar Exercises  hip depression pushing hip down away from ribs; hip protraction  and retraction - both 8-10 reps moving slowly for stretch no pain Rt sidelying over pillow to elongate Lt trunk      Lumbar Exercises: Quadruped   Madcat/Old Horse  5 reps    Other Quadruped Lumbar Exercises  child's pose x 2 reps x 30 sec hold       Moist Heat Therapy   Number Minutes Moist Heat  20 Minutes    Moist Heat Location  Lumbar Spine Lt thoracic/rib area       Electrical Stimulation   Electrical Stimulation Location  Lt lateral lower ribs    Electrical Stimulation Action  IFC    Electrical Stimulation Parameters  to tolerance    Electrical Stimulation Goals  Pain;Tone      Iontophoresis   Type of Iontophoresis  Dexamethasone    Location  Lt lateral inferior ribs    Dose  120 mAmp    Time  10-12 hours       Manual Therapy   Manual therapy comments  pt supine  Soft tissue mobilization  deep tissue work through the Lt lateral trunk through the junction of lats and transverse abdominals - noted increased tightness Lt compared to Rt; working through the Lt lower thoracic area/ribs     Myofascial Release  Lt lateral trunk in sidelying              PT Education - 08/23/17 1525    Education provided  Yes    Education Details  HEP     Person(s) Educated  Patient    Methods  Explanation;Demonstration;Tactile cues;Verbal cues;Handout    Comprehension  Verbalized understanding;Returned demonstration;Verbal cues required;Tactile cues required          PT Long Term Goals - 08/23/17 1449      PT LONG TERM GOAL #1   Title  Improve segmental spine mobility with patient to demonstrated full pain free ROM throughtout trunk 09/28/17    Time  6    Period  Weeks    Status  On-going      PT LONG TERM GOAL #2   Title  Patient to demonstrate good core stability allowing her to participate in stabilization program without pain or difficult 09/28/17    Time  6    Period  Weeks    Status  On-going      PT LONG TERM GOAL #3   Title  Patient reports that she can sit; stand;  walk for functional times without increase in symptoms 09/28/17    Time  6    Period  Weeks    Status  On-going      PT LONG TERM GOAL #4   Title  Independent in HEP 09/28/17    Time  6    Period  Weeks    Status  On-going      PT LONG TERM GOAL #5   Title  Improve FOTO to </=15% limitation 04/25/16    Time  6    Period  Weeks    Status  On-going            Plan - 08/23/17 1526    Clinical Impression Statement  some improvement in symptoms with patient now experiencing less "deep pain" - more soreness. She is now able to sleep. She can get some relief with TENS and heating pad. Paient was encouraged to use the TENS unit more frequently during the day. tolerated soft tissue work and new exercises well. Progressing gradually toward stated goals of therapy.     Rehab Potential  Good    PT Frequency  1x / week    PT Duration  6 weeks    PT Treatment/Interventions  Patient/family education;ADLs/Self Care Home Management;Cryotherapy;Electrical Stimulation;Iontophoresis /ml Dexamethasone;Moist Heat;Ultrasound;Dry needling;Manual techniques;Neuromuscular re-education;Therapeutic activities;Therapeutic exercise    PT Next Visit Plan  review HEP; continue ionto and modalities; manual work through the Lt lower rib/side area; modalities as indicated     Consulted and Agree with Plan of Care  Patient       Patient will benefit from skilled therapeutic intervention in order to improve the following deficits and impairments:  Postural dysfunction, Improper body mechanics, Increased fascial restricitons, Increased muscle spasms, Hypomobility, Decreased range of motion, Decreased activity tolerance  Visit Diagnosis: Pain in thoracic spine  Other symptoms and signs involving the musculoskeletal system  Acute bilateral low back pain without sciatica     Problem List Patient Active Problem List   Diagnosis Date Noted  . History of anemia 08/05/2017  . Vitamin D deficiency 08/05/2017  .  Former smoker 08/05/2017  . Intercostal pain 08/05/2017  . Cervical high risk HPV (human papillomavirus) test positive 07/25/2017  . Anxiety 07/25/2017  . Dysmenorrhea 07/25/2017  . Stenosis of cervix 07/25/2017  . Nicotine dependence 07/25/2017  . 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 Family history of colon cancer 08/12/2014  . History of abnormal cervical Pap smear 08/12/2014  . Sickle cell trait (HCC) 09/28/2012  . Acne 09/28/2012    Breeann Reposa Rober Minion PT, MPH  08/23/2017, 3:29 PM  [facility] [address] [place], [place], [address] Phone: [phone]   Fax:  [phone]  Name: SHALIN LINDERS MRN: [id number] Date of Birth: 01/21/1976

## 2017-08-24 ENCOUNTER — Encounter: Payer: Self-pay | Admitting: Physician Assistant

## 2017-08-30 ENCOUNTER — Ambulatory Visit: Payer: 59 | Admitting: Rehabilitative and Restorative Service Providers"

## 2017-08-30 ENCOUNTER — Encounter: Payer: Self-pay | Admitting: Rehabilitative and Restorative Service Providers"

## 2017-08-30 DIAGNOSIS — R29898 Other symptoms and signs involving the musculoskeletal system: Secondary | ICD-10-CM

## 2017-08-30 DIAGNOSIS — M545 Low back pain, unspecified: Secondary | ICD-10-CM

## 2017-08-30 DIAGNOSIS — M546 Pain in thoracic spine: Secondary | ICD-10-CM | POA: Diagnosis not present

## 2017-08-30 NOTE — Patient Instructions (Addendum)
Upper / Lower Extremity Extension (All-Fours)   Lift leg so toes are off surface about 1 inch  Tighten core and raise right leg and opposite arm. Keep core tight. Repeat __10__ times per set. Do __1-2__ sets per session. Do __1__ sessions per day.  Sash    On back, knees bent, feet flat, left hand on left hip, right hand above left. Pull right arm DIAGONALLY (hip to shoulder) across chest. Bring right arm along head toward floor. Hold momentarily. Slowly return to starting position. Repeat _10__ times. Do with left arm. Band color _red _____

## 2017-08-30 NOTE — Therapy (Addendum)
[address] [facility] [place], [place], [address] Phone: [phone]   Fax:  [phone]  Physical Therapy Treatment  Patient Details  Name: Alexandra Turner MRN: [id number] Date of Birth: 11/07/1975 Referring Provider: Nelson Chimes PA-C   Encounter Date: 08/30/2017  PT End of Session - 08/30/17 1444    Visit Number  3    Number of Visits  6    Date for PT Re-Evaluation  09/28/17    PT Start Time  9163    PT Stop Time  1543    PT Time Calculation (min)  59 min    Activity Tolerance  Patient tolerated treatment well       Past Medical History:  Diagnosis Date  . Depression   . Sickle cell trait ([place])   . Vitamin D deficiency     Past Surgical History:  Procedure Laterality Date  . BREAST SURGERY     excision of benign mass, unknown laterality  . CESAREAN SECTION    . INTRAUTERINE DEVICE (IUD) INSERTION      There were no vitals filed for this visit.  Subjective Assessment - 08/30/17 1446    Subjective  Less pain, less frequent pain. She notices pain in Lt side with bending forward; leaning to the side; lifting leg in the shower. Pleased with progress. continuing to do her HEP and use her TENS unit.     Currently in Pain?  Yes    Pain Score  0-No pain 3/10 with overuse and certain movements     Pain Location  Rib cage    Pain Orientation  Left    Pain Descriptors / Indicators  Dull;Sore;Aching    Pain Type  Acute pain    Pain Onset  More than a month ago    Pain Frequency  Intermittent         [facility] PT Assessment - 08/30/17 0001      Assessment   Medical Diagnosis  Lt intercostal pain     Referring Provider  Nelson Chimes PA-C    Onset Date/Surgical Date  06/08/17    Hand Dominance  Right    Next MD Visit  09/15/17    Prior Therapy  none       AROM   AROM Assessment Site  -- sime strengthing with trunk ROM at end ranges     Right/Left Hip  -- WFL's bilat hips     Lumbar Flexion  90%    Lumbar Extension  75%    Lumbar - Right Side Bend  75%    Lumbar - Left Side Bend  70%    Lumbar - Right Rotation  55%    Lumbar - Left Rotation  50%      Palpation   Spinal mobility  WFL's thoracic and lumbar CPA and lateral mobs - no pain     Palpation comment  no pain with spring testing through the ribs bilat in into the sternum with the exception of lower Lt lateral ribs - minimal discomfort with direct pressure through the ribs - there is much less  tenderness in intercostal space of ribs 10 and 11  - decreasing tightness through the transfer abdominal and lat junction Lt                    [facility] Adult PT Treatment/Exercise - 08/30/17 0001      Lumbar Exercises: Stretches   Double Knee to Chest Stretch  3 reps;20  seconds    Other Lumbar Stretch Exercise  lateral flexion with stretch for thoracic spine 30 sec x 2 to Rt to stretch Lt       Lumbar Exercises: Supine   Other Supine Lumbar Exercises  SASH x 10 each side red TB      Lumbar Exercises: Prone   Other Prone Lumbar Exercises  hip depression pushing hip down away from ribs; hip protraction and retraction - both 8-10 reps moving slowly for stretch no pain Rt sidelying over pillow to elongate Lt trunk      Lumbar Exercises: Quadruped   [place]  5 reps    Straight Leg Raise  10 reps    Opposite Arm/Leg Raise  10 reps    Other Quadruped Lumbar Exercises  child's pose x 2 reps x 30 sec hold       Moist Heat Therapy   Number Minutes Moist Heat  15 Minutes    Moist Heat Location  Lumbar Spine Lt thoracic/rib area       Electrical Stimulation   Electrical Stimulation Location  Lt lateral lower ribs    Electrical Stimulation Action  IFC    Electrical Stimulation Parameters  to tolerance    Electrical Stimulation Goals  Pain;Tone      Iontophoresis   Type of Iontophoresis  Dexamethasone    Location  Lt lateral inferior ribs    Dose  120 mAmp    Time  10-12 hours       Manual Therapy   Manual therapy  comments  pt supine     Soft tissue mobilization  deep tissue work through the Lt lateral trunk through the junction of lats and transverse abdominals - noted increased tightness Lt compared to Rt; working through the [place] lower thoracic area/ribs     Myofascial Release  Lt lateral trunk in sidelying              PT Education - 08/30/17 1502    Education provided  Yes    Education Details  HEP     Person(s) Educated  Patient    Methods  Explanation;Demonstration;Tactile cues;Verbal cues;Handout    Comprehension  Verbalized understanding;Returned demonstration;Verbal cues required;Tactile cues required          PT Long Term Goals - 08/30/17 1444      PT LONG TERM GOAL #1   Title  Improve segmental spine mobility with patient to demonstrated full pain free ROM throughtout trunk 09/28/17    Time  6    Period  Weeks    Status  Achieved      PT LONG TERM GOAL #2   Title  Patient to demonstrate good core stability allowing her to participate in stabilization program without pain or difficult 09/28/17    Time  6    Period  Weeks    Status  Achieved      PT LONG TERM GOAL #3   Title  Patient reports that she can sit; stand; walk for functional times without increase in symptoms 09/28/17    Time  6    Period  Weeks    Status  Achieved      PT LONG TERM GOAL #4   Title  Independent in HEP 09/28/17    Time  6    Period  Weeks    Status  Achieved      PT LONG TERM GOAL #5   Title  Improve FOTO to </=15% limitation 04/25/16  Time  6    Period  Weeks    Status  Achieved            Plan - 08/30/17 1451    Clinical Impression Statement  Good improvement with increaseing mobility through thoracic spine and trunk with less pulling and pain. Patient added core stabilization with diagonal UE work in supine without problems. Excellent progress toward goals.     Rehab Potential  Good    PT Frequency  1x / week    PT Duration  6 weeks    PT Treatment/Interventions  Patient/family  education;ADLs/Self Care Home Management;Cryotherapy;Electrical Stimulation;Iontophoresis 29m/ml Dexamethasone;Moist Heat;Ultrasound;Dry needling;Manual techniques;Neuromuscular re-education;Therapeutic activities;Therapeutic exercise    PT Next Visit Plan  place on hold x 2 weeks - patient will call with any questions or problems     Consulted and Agree with Plan of Care  Patient       Patient will benefit from skilled therapeutic intervention in order to improve the following deficits and impairments:  Postural dysfunction, Improper body mechanics, Increased fascial restricitons, Increased muscle spasms, Hypomobility, Decreased range of motion, Decreased activity tolerance  Visit Diagnosis: Pain in thoracic spine  Other symptoms and signs involving the musculoskeletal system  Acute bilateral low back pain without sciatica     Problem List Patient Active Problem List   Diagnosis Date Noted  . History of anemia 08/05/2017  . Vitamin D deficiency 08/05/2017  . Former smoker 08/05/2017  . Intercostal pain 08/05/2017  . Cervical high risk HPV (human papillomavirus) test positive 07/25/2017  . Anxiety 07/25/2017  . Dysmenorrhea 07/25/2017  . Stenosis of cervix 07/25/2017  . Nicotine dependence 07/25/2017  . 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 Family history of colon cancer 08/12/2014  . History of abnormal cervical Pap smear 08/12/2014  . [place] 09/28/2012  . Acne 09/28/2012    Altha Sweitzer PNilda SimmerPT, MPH  08/30/2017, 3:31 PM  [facility] [address]Phone: [phone]  Fax:  [phone] Name: TSHANNELL MIKKELSENMRN: [id number] of Birth: [birth year] PHYSICAL THERAPY DISCHARGE SUMMARY  Visits from Start of Care: 3  Current functional level related to goals / functional outcomes: See last progress note for discharge status   Remaining deficits: Unknown    Education /  Equipment: HEP Plan: Patient agrees to discharge.  Patient goals were partially met. Patient is being discharged due to being pleased with the current functional level.  ?????     Shelisa Fern P. HHelene KelpPT, MPH 10/01/17 2:15 PM

## 2017-09-12 ENCOUNTER — Encounter: Payer: Self-pay | Admitting: Physician Assistant

## 2017-09-12 ENCOUNTER — Ambulatory Visit (INDEPENDENT_AMBULATORY_CARE_PROVIDER_SITE_OTHER): Payer: 59 | Admitting: Family Medicine

## 2017-09-12 ENCOUNTER — Encounter: Payer: Self-pay | Admitting: Family Medicine

## 2017-09-12 VITALS — BP 115/48 | HR 79 | Ht 68.0 in | Wt 200.0 lb

## 2017-09-12 DIAGNOSIS — R0782 Intercostal pain: Secondary | ICD-10-CM | POA: Diagnosis not present

## 2017-09-12 NOTE — Patient Instructions (Addendum)
Thank you for coming in today. Ok to resume exercise.  If not better in 2 months let me know.  Let me know sooner if worsening.  Make sure you OBGYN send a copy of the pap smear the [facility].   +

## 2017-09-12 NOTE — Progress Notes (Signed)
Alexandra Turner is a 42 y.o. female who presents to [name]: Primary Care Sports Medicine today for left flank pain.  Alexandra Turner was seen on April 17 by her PCP for left flank pain.  Etiology was unclear.  She was thought to have intercostal pain.  She notes that she had a chest x-ray performed at an outside medical clinic that was unremarkable.  At that point she had been uncomfortable for some time.  Her primary care provider was concerned for muscular dysfunction refer to physical therapy and recommended follow-up with me.  Fortunately however Alexandra Turner has done extremely well with physical therapy and had almost complete resolution of symptoms.  She continues to experience some mild left flank and left lateral rib pain but overall is much improved.  She will occasionally experience some tingling sensation across the left lateral ribs extending to the anterior chest wall inferior to her left breast.  She notes this is very intermittent and mild and not particularly bothersome.  She denies any fevers or chills or trouble breathing.   ROS as above:  Exam:  BP (!) 115/48   Pulse 79   Ht  (1.727 m)   Wt 200 lb (90.7 kg)   BMI 30.41 kg/m  Gen: Well NAD HEENT: EOMI,  MMM Lungs: Normal work of breathing. CTABL Heart: RRR no MRG Abd: NABS, Soft. Nondistended, Nontender Exts: Brisk capillary refill, warm and well perfused.  MSK: Cervical and thoracic spine are nontender. Motor motion is normal. Chest wall is not very tender.  Lab and Radiology Results Chest x-ray results from [facility] reviewed  Assessment and Plan: 42 y.o. female with resolving intercostal versus periscapular pain.  Plan for continued home exercises and physical therapy type exercises.  Patient describes thoracic radicular symptoms however these are improving and not very bothersome.  If they are continuing for several more weeks or  months it may be reasonable to proceed with further work-up however I am optimistic that Alexandra Turner will continue to resolve.  Recheck as needed.   No orders of the defined types were placed in this encounter.  No orders of the defined types were placed in this encounter.    Historical information moved to improve visibility of documentation.  Past Medical History:  Diagnosis Date  . Depression   . Sickle cell trait (H[facility])   . Vitamin D deficiency    Past Surgical History:  Procedure Laterality Date  . BREAST SURGERY     excision of benign mass, unknown laterality  . CESAREAN SECTION    . INTRAUTERINE DEVICE (IUD) INSERTION     Social History   Tobacco Use  . Smoking status: Former Smoker    Packs/day: 0.75    Years: 15.00    Pack years: 11.25    Types: Cigarettes    Last attempt to quit: 03/22/2013    Years since quitting: 4.4  . Smokeless tobacco: Never Used  Substance Use Topics  . Alcohol use: Yes    Alcohol/week: 8.4 oz    Types: 14 Standard drinks or equivalent per week   family history includes Cancer in her brother; Colon cancer in her mother; Hodgkin's lymphoma in her sister; Hypertension in her mother; Kidney disease in her brother; Stroke in her maternal grandfather.  Medications: Current Outpatient Medications  Medication Sig Dispense Refill  . cyclobenzaprine (FLEXERIL) 5 MG tablet Take 1-2 tablets (5-10 mg total) by mouth at bedtime as needed for muscle spasms. 30 tablet  1  . Ergocalciferol 2500 units CAPS Take 5,000 Units by mouth daily. 30 capsule   . levonorgestrel (MIRENA, 52 MG,) 20 MCG/24HR IUD Mirena 20 mcg/24 hours (5 yrs) 52 mg intrauterine device    . naproxen (NAPROSYN) 500 MG tablet Take 1 tablet (500 mg total) by mouth 2 (two) times daily with a meal. 30 tablet 0  . omeprazole (PRILOSEC) 20 MG capsule Take 1 capsule (20 mg total) by mouth daily. 30 capsule 0   No current facility-administered medications for this visit.    No Known  Allergies  Health Maintenance Health Maintenance  Topic Date Due  . INFLUENZA VACCINE  11/22/2017  . PAP SMEAR  07/10/2019  . TETANUS/TDAP  06/22/2020  . HIV Screening  Completed    Discussed warning signs or symptoms. Please see discharge instructions. Patient expresses understanding.

## 2018-07-09 ENCOUNTER — Ambulatory Visit: Payer: PRIVATE HEALTH INSURANCE | Admitting: Psychology

## 2020-04-01 ENCOUNTER — Other Ambulatory Visit: Payer: Self-pay | Admitting: Specialist

## 2020-04-01 DIAGNOSIS — Z1231 Encounter for screening mammogram for malignant neoplasm of breast: Secondary | ICD-10-CM

## 2020-04-05 DIAGNOSIS — A5901 Trichomonal vulvovaginitis: Secondary | ICD-10-CM | POA: Insufficient documentation

## 2020-05-20 ENCOUNTER — Ambulatory Visit: Payer: 59

## 2020-06-15 ENCOUNTER — Other Ambulatory Visit: Payer: Self-pay

## 2020-06-15 ENCOUNTER — Ambulatory Visit (HOSPITAL_BASED_OUTPATIENT_CLINIC_OR_DEPARTMENT_OTHER)
Admission: RE | Admit: 2020-06-15 | Discharge: 2020-06-15 | Disposition: A | Discharge status: Home / Self Care | Payer: 59 | Source: Ambulatory Visit | Attending: Specialist | Admitting: Specialist

## 2020-06-15 ENCOUNTER — Encounter (HOSPITAL_BASED_OUTPATIENT_CLINIC_OR_DEPARTMENT_OTHER): Payer: Self-pay

## 2020-06-15 DIAGNOSIS — Z1231 Encounter for screening mammogram for malignant neoplasm of breast: Secondary | ICD-10-CM | POA: Insufficient documentation

## 2021-02-02 ENCOUNTER — Ambulatory Visit (HOSPITAL_BASED_OUTPATIENT_CLINIC_OR_DEPARTMENT_OTHER)
Admission: RE | Admit: 2021-02-02 | Discharge: 2021-02-02 | Disposition: A | Discharge status: Home / Self Care | Payer: 59 | Source: Ambulatory Visit | Attending: Family Medicine | Admitting: Family Medicine

## 2021-02-02 ENCOUNTER — Other Ambulatory Visit: Payer: Self-pay

## 2021-02-02 ENCOUNTER — Ambulatory Visit (INDEPENDENT_AMBULATORY_CARE_PROVIDER_SITE_OTHER): Payer: 59 | Admitting: Family Medicine

## 2021-02-02 ENCOUNTER — Encounter: Payer: Self-pay | Admitting: Family Medicine

## 2021-02-02 VITALS — Ht 68.0 in | Wt 198.0 lb

## 2021-02-02 DIAGNOSIS — M533 Sacrococcygeal disorders, not elsewhere classified: Secondary | ICD-10-CM

## 2021-02-02 DIAGNOSIS — M24151 Other articular cartilage disorders, right hip: Secondary | ICD-10-CM

## 2021-02-02 NOTE — Progress Notes (Signed)
  Alexandra Turner - 45 y.o. female MRN [id number]  Date of birth: Jun 03, 1975  SUBJECTIVE:  Including CC & ROS.  No chief complaint on file.   Alexandra Turner is a 45 y.o. female that is presenting with right hip pain.  The pain has been ongoing for the past few weeks.  The pain is occurring in the right groin.  No injury or inciting event.  Tries to stay active and work out on a regular basis.   Review of Systems See HPI   HISTORY: Past Medical, Surgical, Social, and Family History Reviewed & Updated per EMR.   Pertinent Historical Findings include:  Past Medical History:  Diagnosis Date   Depression    Sickle cell trait (HCC)    Vitamin D deficiency     Past Surgical History:  Procedure Laterality Date   BREAST EXCISIONAL BIOPSY Left    BREAST SURGERY     excision of benign mass, unknown laterality   CESAREAN SECTION     INTRAUTERINE DEVICE (IUD) INSERTION      Family History  Problem Relation Age of Onset   Colon cancer Mother    Hypertension Mother    Hodgkin's lymphoma Sister    Stroke Maternal Grandfather    Kidney disease Brother    Cancer Brother     Social History   Socioeconomic History   Marital status: Single    Spouse name: Not on file   Number of children: Not on file   Years of education: Not on file   Highest education level: Not on file  Occupational History   Not on file  Tobacco Use   Smoking status: Former    Packs/day: 0.75    Years: 15.00    Pack years: 11.25    Types: Cigarettes    Quit date: 03/22/2013    Years since quitting: 7.8   Smokeless tobacco: Never  Vaping Use   Vaping Use: Every day  Substance and Sexual Activity   Alcohol use: Yes    Alcohol/week: 14.0 standard drinks    Types: 14 Standard drinks or equivalent per week   Drug use: No   Sexual activity: Yes    Birth control/protection: I.U.D.  Other Topics Concern   Not on file  Social History Narrative   Not on file   Social Determinants of Health   Financial  Resource Strain: Not on file  Food Insecurity: Not on file  Transportation Needs: Not on file  Physical Activity: Not on file  Stress: Not on file  Social Connections: Not on file  Intimate Partner Violence: Not on file     PHYSICAL EXAM:  VS: Ht 5\' 8"  (1.727 m)   Wt 198 lb (89.8 kg)   BMI 30.11 kg/m  Physical Exam Gen: NAD, alert, cooperative with exam, well-appearing      ASSESSMENT & PLAN:   Sacroiliac joint dysfunction of right side Having pain over the SI joint which could be contributing to the pain she experiences around the hip. -Counseled on home exercise therapy and supportive care. -Could consider injection or physical therapy.  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 -Counseled on home exercise therapy and supportive care. -X-ray. -Could consider injection physical therapy.

## 2021-02-02 NOTE — Patient Instructions (Signed)
Nice to meet you Please try voltaren  Please try heat  Please try the exercises  I will call with the xray results.   Please send me a message in MyChart with any questions or updates.  Please see me back in 4 weeks.   --Dr. Jordan Likes

## 2021-02-03 DIAGNOSIS — M24151 Other articular cartilage disorders, right hip: Secondary | ICD-10-CM | POA: Insufficient documentation

## 2021-02-03 DIAGNOSIS — M533 Sacrococcygeal disorders, not elsewhere classified: Secondary | ICD-10-CM | POA: Insufficient documentation

## 2021-02-03 NOTE — Assessment & Plan Note (Signed)
Having pain over the SI joint which could be contributing to the pain she experiences around the hip. -Counseled on home exercise therapy and supportive care. -Could consider injection or physical therapy.

## 2021-02-03 NOTE — Assessment & Plan Note (Signed)
Symptoms seem more consistent with a labral issue with no inciting event. -Counseled on home exercise therapy and supportive care. -X-ray. -Could consider injection physical therapy.

## 2021-02-07 ENCOUNTER — Telehealth: Payer: Self-pay | Admitting: Family Medicine

## 2021-02-07 NOTE — Telephone Encounter (Signed)
Left VM for patient. If she calls back please have her speak with a nurse/CMA and inform that her xrays are normal. We will continue with the plan that we discussed.   If any questions then please take the best time and phone number to call and I will try to call her back.   Myra Rude, MD [facility] 02/07/2021, 9:04 AM

## 2021-03-02 ENCOUNTER — Ambulatory Visit (INDEPENDENT_AMBULATORY_CARE_PROVIDER_SITE_OTHER): Payer: 59 | Admitting: Family Medicine

## 2021-03-02 ENCOUNTER — Encounter: Payer: Self-pay | Admitting: Family Medicine

## 2021-03-02 DIAGNOSIS — M24151 Other articular cartilage disorders, right hip: Secondary | ICD-10-CM

## 2021-03-02 NOTE — Progress Notes (Signed)
  Alexandra Turner - 45 y.o. female MRN [id number]  Date of birth: 1976/02/09  SUBJECTIVE:  Including CC & ROS.  No chief complaint on file.   Alexandra Turner is a 45 y.o. female that is  following up for her right hip pain. Has been doing well with home therapy and going to stretch zone.  Independent review of the right hip x-ray from 10/12 shows no acute changes.    Review of Systems See HPI   HISTORY: Past Medical, Surgical, Social, and Family History Reviewed & Updated per EMR.   Pertinent Historical Findings include:  Past Medical History:  Diagnosis Date   Depression    Sickle cell trait (HCC)    Vitamin D deficiency     Past Surgical History:  Procedure Laterality Date   BREAST EXCISIONAL BIOPSY Left    BREAST SURGERY     excision of benign mass, unknown laterality   CESAREAN SECTION     INTRAUTERINE DEVICE (IUD) INSERTION      Family History  Problem Relation Age of Onset   Colon cancer Mother    Hypertension Mother    Hodgkin's lymphoma Sister    Stroke Maternal Grandfather    Kidney disease Brother    Cancer Brother     Social History   Socioeconomic History   Marital status: Single    Spouse name: Not on file   Number of children: Not on file   Years of education: Not on file   Highest education level: Not on file  Occupational History   Not on file  Tobacco Use   Smoking status: Former    Packs/day: 0.75    Years: 15.00    Pack years: 11.25    Types: Cigarettes    Quit date: 03/22/2013    Years since quitting: 7.9   Smokeless tobacco: Never  Vaping Use   Vaping Use: Every day  Substance and Sexual Activity   Alcohol use: Yes    Alcohol/week: 14.0 standard drinks    Types: 14 Standard drinks or equivalent per week   Drug use: No   Sexual activity: Yes    Birth control/protection: I.U.D.  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 -Counseled on home exercise therapy and supportive care. -Could consider physical therapy.

## 2021-03-02 NOTE — Assessment & Plan Note (Signed)
Has been doing well with home therapy and notices improvement in her function and pain. -Counseled on home exercise therapy and supportive care. -Could consider physical therapy.

## 2021-08-04 ENCOUNTER — Ambulatory Visit (INDEPENDENT_AMBULATORY_CARE_PROVIDER_SITE_OTHER): Payer: 59

## 2021-08-04 ENCOUNTER — Encounter: Payer: Self-pay | Admitting: Podiatry

## 2021-08-04 ENCOUNTER — Ambulatory Visit (INDEPENDENT_AMBULATORY_CARE_PROVIDER_SITE_OTHER): Payer: 59 | Admitting: Podiatry

## 2021-08-04 DIAGNOSIS — M722 Plantar fascial fibromatosis: Secondary | ICD-10-CM

## 2021-08-04 DIAGNOSIS — M79672 Pain in left foot: Secondary | ICD-10-CM

## 2021-08-04 DIAGNOSIS — G8929 Other chronic pain: Secondary | ICD-10-CM

## 2021-08-04 MED ORDER — MELOXICAM 15 MG PO TABS: 15.0000 mg | ORAL_TABLET | Freq: Every day | ORAL | 0 refills | Status: DC

## 2021-08-04 NOTE — Progress Notes (Signed)
?  Subjective:  ?Patient ID: Alexandra Turner, female    DOB: 1976-03-18,   MRN: [id number] ? ?Chief Complaint  ?Patient presents with  ? Plantar Fasciitis  ?  I have some heel pain on the left and has been going on for about 4 weeks and tightness and soreness and feels like a stone bruise and better when I am moving around and hurts after I have sat and try to get up and pilates made it worse  ? ? ?46 y.o. female presents for left heel pain  that has been going on for about four weeks. Relates she does piliates and noticed she was starting to have more pain in the heel. Relates first steps in morning or after sitting are the worse. Denies any treatment. Denies any other pedal complaints. Denies n/v/f/c.  ? ?Past Medical History:  ?Diagnosis Date  ? Depression   ? Sickle cell trait ([place])   ? Vitamin D deficiency   ? ? ?Objective:  ?Physical Exam: ?Vascular: DP/PT pulses 2/4 bilateral. CFT <3 seconds. Normal hair growth on digits. No edema.  ?Skin. No lacerations or abrasions bilateral feet.  ?Musculoskeletal: MMT 5/5 bilateral lower extremities in DF, PF, Inversion and Eversion. Deceased ROM in DF of ankle joint. Tender to medial calcaneal tubercle on the left. No pain with calcaneal squeeze. No pain along arch. Some pain distally near ball of foot. No pain along PT or achilles tendon.  ?Neurological: Sensation intact to light touch.  ? ?Assessment:  ? ?1. Plantar fasciitis, left   ? ? ? ?Plan:  ?Patient was evaluated and treated and all questions answered. ?Discussed plantar fasciitis with patient.  ?X-rays reviewed and discussed with patient. No acute fractures or dislocations noted. Mild spurring noted at inferior calcaneus.  ?Discussed treatment options including, ice, NSAIDS, supportive shoes, bracing, and stretching. Stretching exercises provided to be done on a daily basis.   ?Prescription for meloxicam provided and sent to pharmacy. ?PF brace provided.  ?Follow-up 6 weeks or sooner if any problems arise. In the  meantime, encouraged to call the office with any questions, concerns, change in symptoms.  ? ? ? ? ?Lorenda Peck, DPM  ? ? ?

## 2021-08-04 NOTE — Patient Instructions (Addendum)

## 2021-09-15 ENCOUNTER — Encounter: Payer: Self-pay | Admitting: Podiatry

## 2021-09-15 ENCOUNTER — Ambulatory Visit (INDEPENDENT_AMBULATORY_CARE_PROVIDER_SITE_OTHER): Payer: 59 | Admitting: Podiatry

## 2021-09-15 DIAGNOSIS — M722 Plantar fascial fibromatosis: Secondary | ICD-10-CM

## 2021-09-15 NOTE — Progress Notes (Signed)
  Subjective:  Patient ID: Alexandra Turner, female    DOB: [birth year],   MRN: [id number]  Chief Complaint  Patient presents with   Foot Pain    Left foot PF f/u    46 y.o. female presents for follow-up of left plantar fasciits. Relates she is doing better about 60%. Has been wearing brace and stretching. Has been taking ibuprofen and not the meloxicam. Happy with progress. Has a trip coming up in June she would like to be better for. Denies any other pedal complaints. Denies n/v/f/c.   Past Medical History:  Diagnosis Date   Depression    Sickle cell trait (HCC)    Vitamin D deficiency     Objective:  Physical Exam: Vascular: DP/PT pulses 2/4 bilateral. CFT <3 seconds. Normal hair growth on digits. No edema.  Skin. No lacerations or abrasions bilateral feet.  Musculoskeletal: MMT 5/5 bilateral lower extremities in DF, PF, Inversion and Eversion. Deceased ROM in DF of ankle joint. Mildly tender to medial calcaneal tubercle on the left. No pain with calcaneal squeeze. No pain along arch. Some pain distally near ball of foot. No pain along PT or achilles tendon.  Neurological: Sensation intact to light touch.   Assessment:   1. Plantar fasciitis, left       Plan:  Patient was evaluated and treated and all questions answered. Discussed plantar fasciitis with patient.  X-rays reviewed and discussed with patient. No acute fractures or dislocations noted. Mild spurring noted at inferior calcaneus.  Discussed treatment options including, ice, NSAIDS, supportive shoes, bracing, and stretching.  Continue stretching and bracing Follow-up as needed     Louann Sjogren, DPM

## 2022-01-18 ENCOUNTER — Ambulatory Visit
Admission: RE | Admit: 2022-01-18 | Discharge: 2022-01-18 | Disposition: A | Discharge status: Home / Self Care | Payer: 59 | Source: Ambulatory Visit

## 2022-01-18 VITALS — BP 133/79 | HR 87 | Temp 99.1°F | Resp 20 | Ht 69.0 in | Wt 205.0 lb

## 2022-01-18 DIAGNOSIS — R590 Localized enlarged lymph nodes: Secondary | ICD-10-CM | POA: Diagnosis not present

## 2022-01-18 HISTORY — DX: Plantar fascial fibromatosis: M72.2

## 2022-01-18 MED ORDER — PREDNISONE 10 MG (21) PO TBPK: ORAL_TABLET | ORAL | 0 refills | Status: DC

## 2022-01-18 NOTE — ED Triage Notes (Signed)
Pt presents to Urgent Care with c/o intermittent L jaw pain and swelling x 4 days which worsens while chewing. She states it feels as if her jaw is "about to lock up."

## 2022-01-18 NOTE — ED Provider Notes (Signed)
[facility]    CSN: [id number] Arrival date & time: 01/18/22  1249      History   Chief Complaint Chief Complaint  Patient presents with   Jaw Pain   Facial Swelling    HPI Alexandra Turner is a 46 y.o. female.  Patient presents with left-sided jaw pain and swelling for the past 4 days.  Patient states onset of symptoms began while eating, with worsening pain while chewing food.  Patient describes pain as "tightness and a bawling" up sensation.  Patient denies any previous history of similar symptoms.  Patient went to dentist on Monday for a teeth cleaning, she denies any abnormalities on exam from dentist.  Patient denies any history of dental abscess or infection within mouth .patient has taken Benadryl with no relief of symptoms.        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/25/2017   Nicotine dependence 07/25/2017   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   Family history of colon cancer 08/12/2014   History of abnormal cervical Pap smear 08/12/2014   Sickle cell trait ([place]) 09/28/2012   Acne 09/28/2012    Past Surgical History:  Procedure Laterality Date   BREAST EXCISIONAL BIOPSY Left    BREAST SURGERY     excision of benign mass, unknown laterality   CESAREAN SECTION     INTRAUTERINE DEVICE (IUD) INSERTION      OB History     Gravida  2   Para  1   Term      Preterm      AB  1    Living         SAB  1   IAB      Ectopic      Multiple      Live Births               Home Medications    Prior to Admission medications   Medication Sig Start Date End Date Taking? Authorizing Provider  cyclobenzaprine (FLEXERIL) 5 MG tablet Take 1-2 tablets (5-10 mg total) by mouth at bedtime as needed for muscle spasms. 08/08/17  Yes Trixie Dredge, PA-C  ibuprofen (ADVIL) 400 MG tablet Take 200 mg by mouth every 6 (six) hours as needed.   Yes [provider]  predniSONE (STERAPRED UNI-PAK 21 TAB) 10 MG (21) TBPK tablet Take 6 tabs by mouth daily for 1st day , then 5 tabs for 2nd day, then 4 tabs for 3rd day , then 3 tabs for 4th day, 2 tabs for 5th day , then 1 tab by mouth daily for the last day 01/18/22  Yes Blaise Grieshaber, Fidela Juneau, NP  Cholecalciferol 1.25 MG (50000 UT) capsule cholecalciferol (vitamin D3) 1,250 mcg (50,000 unit) capsule    [provider]  Ergocalciferol 2500 units CAPS Take 5,000 Units by mouth daily. 07/27/17   Nelson Chimes  Lanora Manis, PA-C  levonorgestrel (MIRENA, 52 MG,) 20 MCG/24HR IUD Mirena 20 mcg/24 hours (5 yrs) 52 mg intrauterine device    [provider]  meloxicam (MOBIC) 15 MG tablet Take 1 tablet (15 mg total) by mouth daily. 08/04/21   Louann Sjogren, DPM  metroNIDAZOLE (METROGEL) 0.75 % vaginal gel metronidazole 0.75 % (37.5 mg/5 gram) vaginal gel    [provider]  omeprazole (PRILOSEC) 20 MG capsule Take 1 capsule (20 mg total) by mouth daily. 08/08/17   Carlis Stable, PA-C    Family History Family History  Problem Relation Age of Onset   Colon cancer Mother    Hypertension Mother    Hodgkin's lymphoma Sister    Kidney disease Brother    Cancer Brother    Stroke Maternal Grandfather     Social History Social History   Tobacco Use   Smoking status: Former    Packs/day: 0.75    Years: 15.00    Total pack years: 11.25    Types: Cigarettes    Quit date: 03/22/2013     Years since quitting: 8.8   Smokeless tobacco: Never  Vaping Use   Vaping Use: Every day  Substance Use Topics   Alcohol use: Yes    Alcohol/week: 7.0 standard drinks of alcohol    Types: 7 Standard drinks or equivalent per week   Drug use: No     Allergies   Patient has no known allergies.   Review of Systems Review of Systems  Constitutional:  Negative for appetite change, fatigue, fever and unexpected weight change.  HENT:  Negative for ear discharge, ear pain, facial swelling, postnasal drip, rhinorrhea, sinus pressure, sinus pain, sore throat, trouble swallowing and voice change.        Left sided submandibular neck swelling and pain     Physical Exam Triage Vital Signs ED Triage Vitals  Enc Vitals Group     BP      Pulse      Resp      Temp      Temp src      SpO2      Weight      Height      Head Circumference      Peak Flow      Pain Score      Pain Loc      Pain Edu?      Excl. in GC?    No data found.  Updated Vital Signs BP 133/79 (BP Location: Right Arm)   Pulse 87   Temp 99.1 F (37.3 C) (Oral)   Resp 20   Ht 5\' 9"  (1.753 m)   Wt 205 lb (93 kg)   LMP  (LMP Unknown)   SpO2 100%   BMI 30.27 kg/m        Physical Exam HENT:     Mouth/Throat:     Mouth: Mucous membranes are moist. No oral lesions or angioedema.     Dentition: Normal dentition.     Tongue: No lesions. Tongue does not deviate from midline.     Palate: No mass and lesions.     Pharynx: Oropharynx is clear. Uvula midline. No pharyngeal swelling, oropharyngeal exudate, posterior oropharyngeal erythema or uvula swelling.     Tonsils: No tonsillar exudate or tonsillar abscesses.  Neck:     Thyroid: No thyroid mass, thyromegaly or thyroid tenderness.  Lymphadenopathy:     Head:     Left side of head: Submandibular adenopathy present.  Comments: Patient has tenderness and swelling present of left submandibular lymph node.       UC Treatments / Results  Labs (all  labs ordered are listed, but only abnormal results are displayed) Labs Reviewed  CBC WITH DIFFERENTIAL/PLATELET    EKG   Radiology No results found.  Procedures Procedures (including critical care time)  Medications Ordered in UC Medications - No data to display  Initial Impression / Assessment and Plan / UC Course  I have reviewed the triage vital signs and the nursing notes.  Pertinent labs & imaging results that were available during my care of the patient were reviewed by me and considered in my medical decision making (see chart for details).   Patient will take 6-day course of prednisone for left-sided submandibular lymph adenopathy.  Patient had CBC lab drawn today in office and was told that she would be followed up with if any abnormalities were present on CBC.  Patient was advised to follow-up with PCP if symptoms are not improving or if worsening of symptoms occur within 3 to 5 days.  Patient verbalized understanding of instructions.   Final Clinical Impressions(s) / UC Diagnoses   Final diagnoses:  Submandibular lymphadenopathy     Discharge Instructions      You were prescribed a 6-day course of a prednisone.  If symptoms worsen or do not improve within 3 days please schedule appointment to follow-up with your primary care provider.  As discussed, if blood work comes back abnormal you will receive a call by the end of the day tomorrow.     ED Prescriptions     Medication Sig Dispense Auth. Provider   predniSONE (STERAPRED UNI-PAK 21 TAB) 10 MG (21) TBPK tablet Take 6 tabs by mouth daily for 1st day , then 5 tabs for 2nd day, then 4 tabs for 3rd day , then 3 tabs for 4th day, 2 tabs for 5th day , then 1 tab by mouth daily for the last day 21 tablet Debby Freiberg, NP      PDMP not reviewed this encounter.   Debby Freiberg, NP 01/18/22 1357

## 2022-01-18 NOTE — Discharge Instructions (Addendum)
You were prescribed a 6-day course of a prednisone.  If symptoms worsen or do not improve within 3 days please schedule appointment to follow-up with your primary care provider.  As discussed, if blood work comes back abnormal you will receive a call by the end of the day tomorrow.

## 2022-01-19 ENCOUNTER — Telehealth: Payer: Self-pay

## 2022-01-19 LAB — CBC WITH DIFFERENTIAL/PLATELET
Absolute Monocytes: 302 cells/uL (ref 200–950)
Basophils Absolute: 32 cells/uL (ref 0–200)
Basophils Relative: 0.7 %
Eosinophils Absolute: 203 cells/uL (ref 15–500)
Eosinophils Relative: 4.5 %
HCT: 34.4 % — ABNORMAL LOW (ref 35.0–45.0)
Hemoglobin: 11.7 g/dL (ref 11.7–15.5)
Lymphs Abs: 1629 cells/uL (ref 850–3900)
MCH: 29.6 pg (ref 27.0–33.0)
MCHC: 34 g/dL (ref 32.0–36.0)
MCV: 87.1 fL (ref 80.0–100.0)
MPV: 10.7 fL (ref 7.5–12.5)
Monocytes Relative: 6.7 %
Neutro Abs: 2336 cells/uL (ref 1500–7800)
Neutrophils Relative %: 51.9 %
Platelets: 311 10*3/uL (ref 140–400)
RBC: 3.95 10*6/uL (ref 3.80–5.10)
RDW: 13 % (ref 11.0–15.0)
Total Lymphocyte: 36.2 %
WBC: 4.5 10*3/uL (ref 3.8–10.8)

## 2022-01-19 NOTE — Telephone Encounter (Signed)
Call made by provider to give lab results. Msg also sent in pts MyChart by provider. [facility] if any questions or concerns.

## 2022-07-12 IMAGING — DX DG HIP (WITH OR WITHOUT PELVIS) 2-3V*R*
3 series · 3 of 3 positions shown · non-contrast
Comparison: None.

CLINICAL DATA: Right hip pain.

EXAM:
DG HIP (WITH OR WITHOUT PELVIS) 2-3V RIGHT

[pelvis ap]
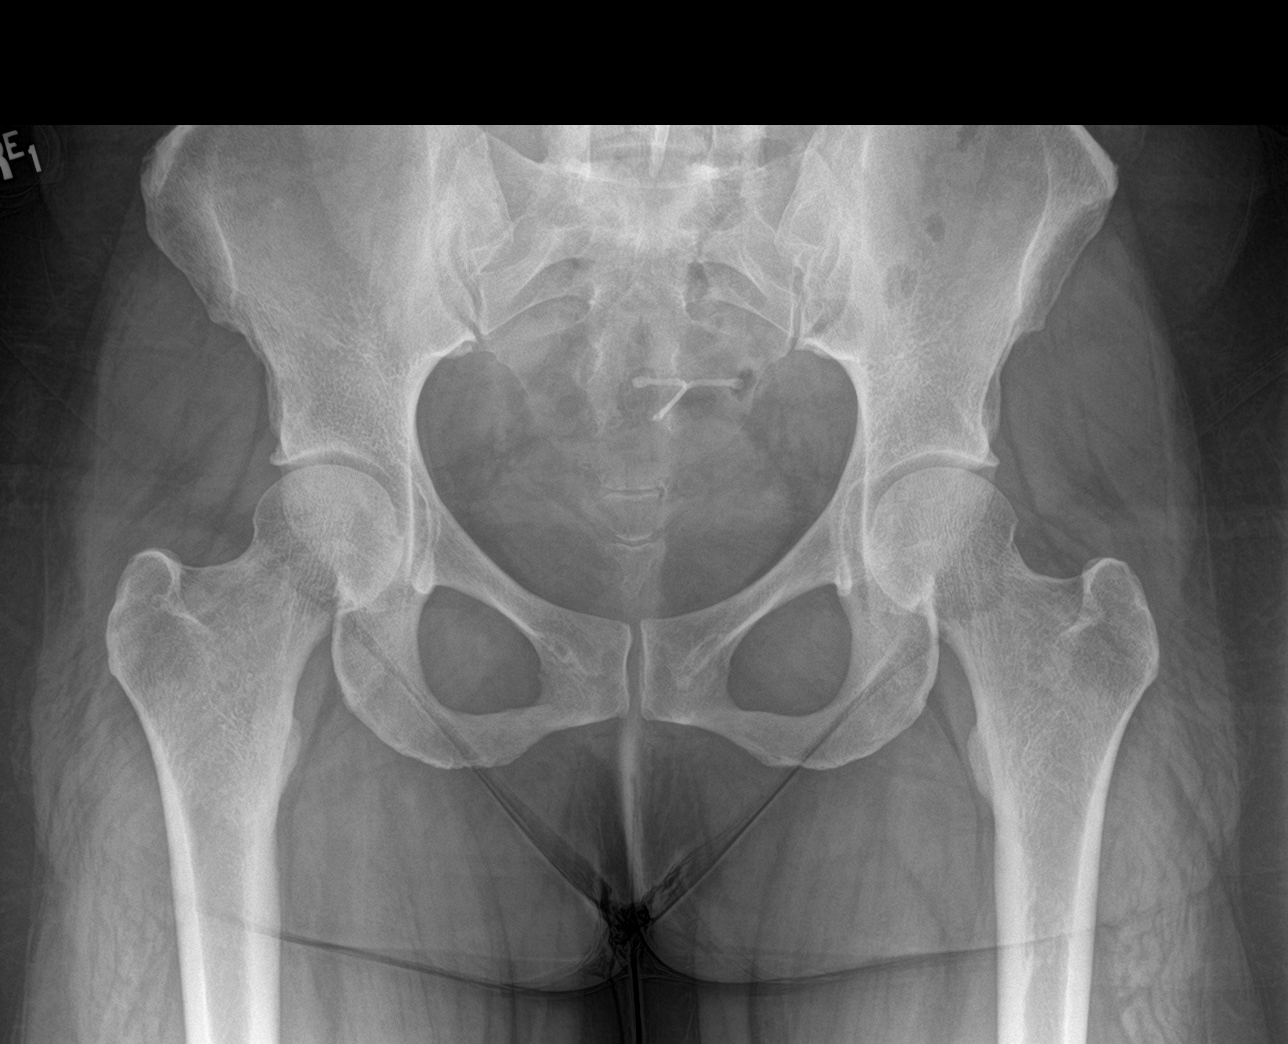

[hip ap]
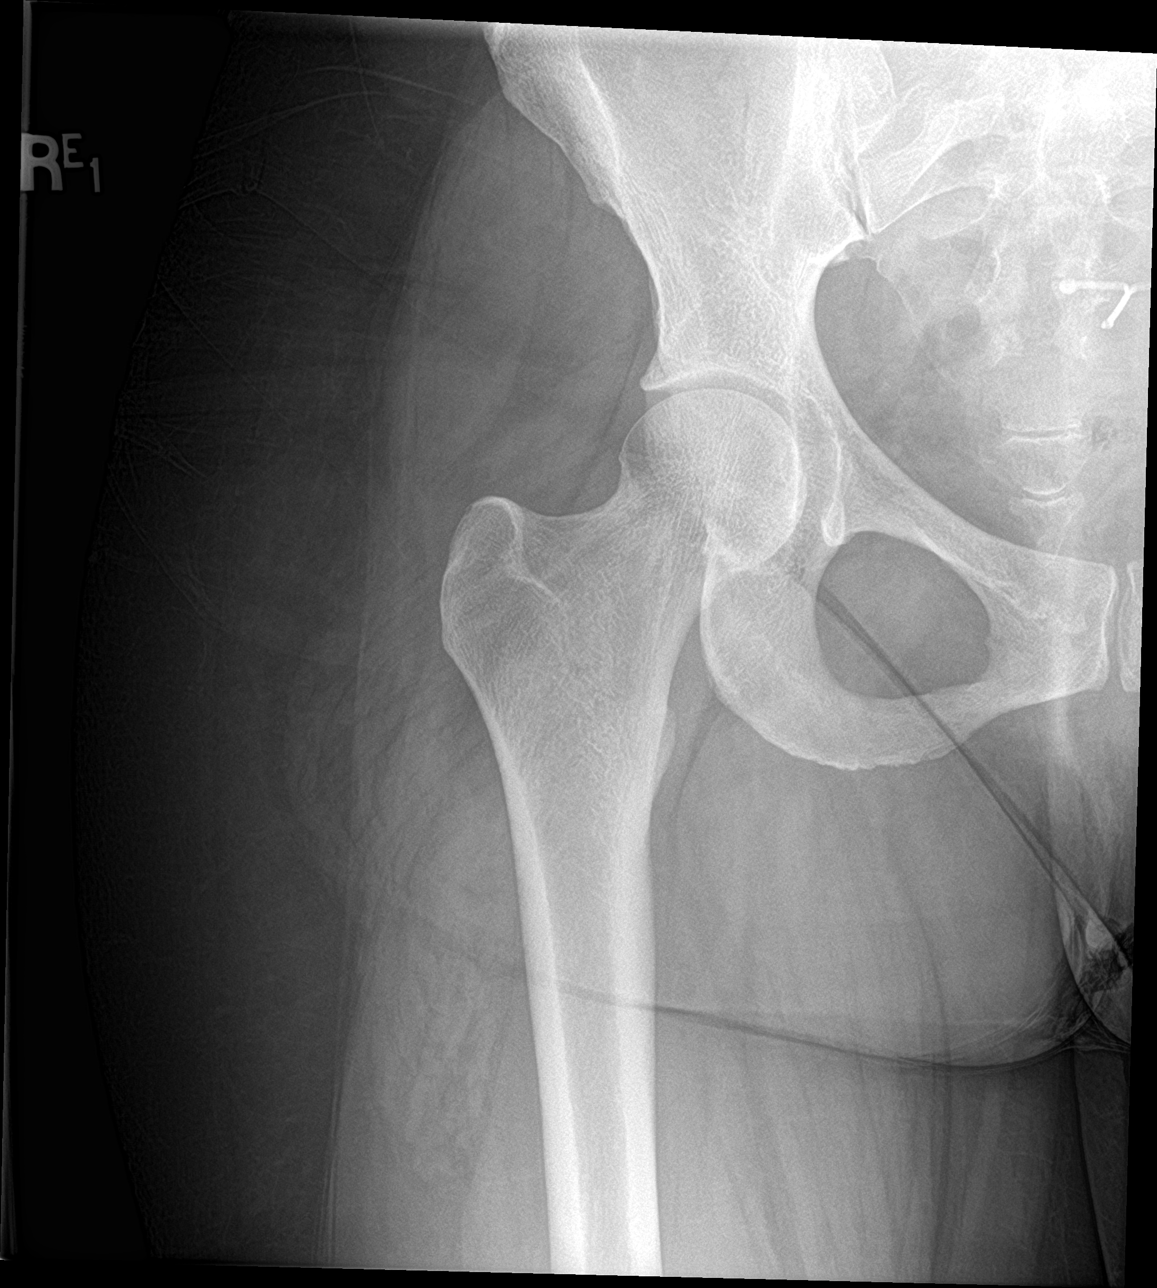

[hip lat]
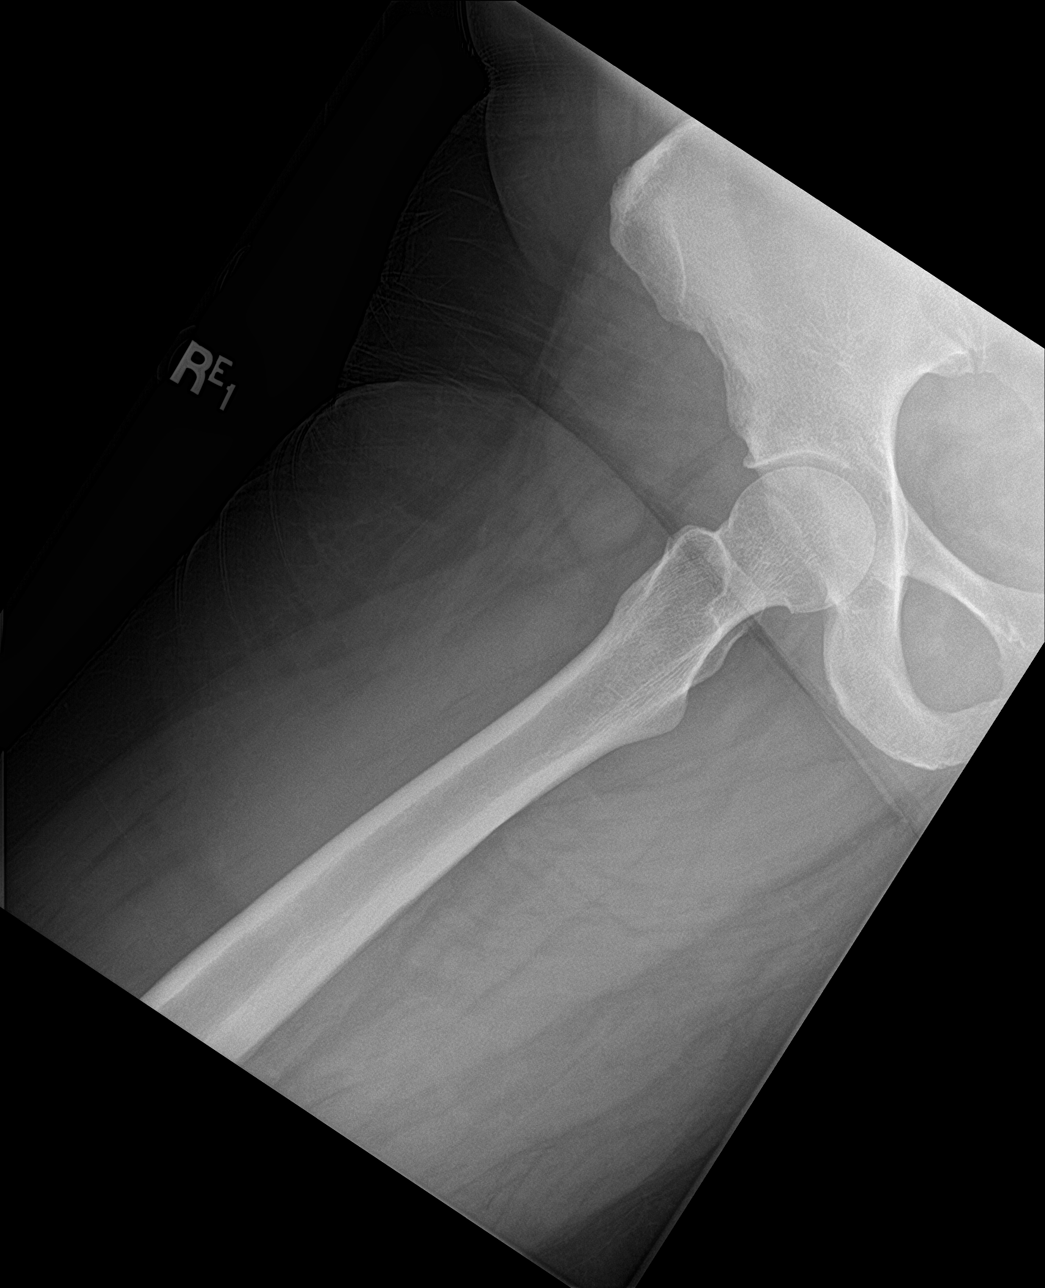

[3 of 3 positions shown; findings below may reference images not displayed]

FINDINGS: There is no evidence of hip fracture or dislocation. There is no
evidence of arthropathy or other focal bone abnormality. An IUD is
in place.
IMPRESSION: Negative.

## 2022-08-07 ENCOUNTER — Encounter: Payer: Self-pay | Admitting: *Deleted

## 2022-09-20 ENCOUNTER — Ambulatory Visit
Admission: EM | Admit: 2022-09-20 | Discharge: 2022-09-20 | Disposition: A | Discharge status: Home / Self Care | Payer: 59 | Attending: Family Medicine | Admitting: Family Medicine

## 2022-09-20 ENCOUNTER — Encounter: Payer: Self-pay | Admitting: Emergency Medicine

## 2022-09-20 DIAGNOSIS — L739 Follicular disorder, unspecified: Secondary | ICD-10-CM | POA: Diagnosis not present

## 2022-09-20 MED ORDER — DOXYCYCLINE HYCLATE 100 MG PO CAPS: 100.0000 mg | ORAL_CAPSULE | Freq: Two times a day (BID) | ORAL | 0 refills | Status: AC

## 2022-09-20 NOTE — Discharge Instructions (Addendum)
Instructed patient take medication as directed with food to completion.  Encouraged increase daily water intake to 64 ounces per day while taking this medication.  Advised patient to avoid topical ointments or creams to this affected area.  Advised if symptoms worsen and/or unresolved please follow-up with PCP or here for further evaluation.

## 2022-09-20 NOTE — ED Triage Notes (Signed)
Reports boil to right upper thigh starting yesterday. Happened one other time two weeks prior, treated it at home and it got better. Used an itchammol salve, expressing it herself, triple antibiotic ointment, never ruptured but eventually went away. Pain is 2/10, worse with movement. Denies fevers, current drainage. Has not used any medications today or yesterday to try to treat at home.

## 2022-09-20 NOTE — ED Provider Notes (Signed)
[facility]    CSN: [id number] Arrival date & time: 09/20/22  1145      History   Chief Complaint Chief Complaint  Patient presents with   Blister    Painful boil on inner thigh - Entered by patient    HPI Alexandra Turner is a 47 y.o. female.   HPI Very pleasant 47 year old female presents with painful boil on inner thigh x 2 weeks.  PMH significant for obesity, sickle cell trait, and depression.  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 02/03/2021   Sacroiliac joint dysfunction of right side 02/03/2021   Trichomonal vaginitis 04/05/2020   History of anemia 08/05/2017   Vitamin D deficiency 08/05/2017   Former smoker 08/05/2017   Intercostal pain 08/05/2017   Cervical high risk HPV (human papillomavirus) test positive 07/25/2017   Anxiety 07/25/2017   Dysmenorrhea 07/25/2017   Stenosis of cervix 07/25/2017   Nicotine dependence 07/25/2017   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   Family history of colon cancer 08/12/2014   History of abnormal cervical Pap smear 08/12/2014   Sickle cell trait ([facility]) 09/28/2012   Acne 09/28/2012    Past Surgical History:  Procedure Laterality Date   BREAST EXCISIONAL BIOPSY Left    BREAST SURGERY     excision of benign mass, unknown laterality   CESAREAN SECTION     INTRAUTERINE DEVICE (IUD) INSERTION      OB History     Gravida  2   Para  1   Term      Preterm      AB  1   Living         SAB  1   IAB      Ectopic      Multiple      Live Births               Home Medications    Prior to Admission medications   Medication Sig Start Date End Date Taking? Authorizing Provider  Cholecalciferol 1.25 MG (50000 UT) capsule cholecalciferol (vitamin D3) 1,250 mcg (50,000 unit) capsule   Yes [provider]  doxycycline (VIBRAMYCIN) 100 MG capsule Take 1 capsule (100 mg total) by mouth 2 (two) times daily for 10 days. 09/20/22 09/30/22 Yes Trevor Iha, FNP  Ergocalciferol 2500 units CAPS Take 5,000 Units by mouth daily. 07/27/17  Yes Carlis Stable, PA-C  cyclobenzaprine (FLEXERIL) 5 MG tablet Take 1-2 tablets (5-10 mg total) by mouth at bedtime as needed for muscle spasms. 08/08/17   Carlis Stable, PA-C  ibuprofen (ADVIL) 400 MG tablet Take 200 mg by mouth every 6 (six) hours as needed.    [provider]  levonorgestrel (MIRENA, 52 MG,) 20 MCG/24HR IUD Mirena 20 mcg/24 hours (5 yrs) 52 mg intrauterine device    [provider]  meloxicam (MOBIC) 15 MG tablet Take 1 tablet (15 mg total) by mouth daily. 08/04/21   Louann Sjogren, DPM  metroNIDAZOLE (METROGEL) 0.75 % vaginal gel metronidazole 0.75 % (37.5 mg/5 gram) vaginal gel    [provider]  omeprazole (PRILOSEC) 20 MG capsule Take 1 capsule (20 mg total) by mouth daily. 08/08/17   Carlis Stable, PA-C  predniSONE (STERAPRED UNI-PAK 21 TAB) 10 MG (21)  TBPK tablet Take 6 tabs by mouth daily for 1st day , then 5 tabs for 2nd day, then 4 tabs for 3rd day , then 3 tabs for 4th day, 2 tabs for 5th day , then 1 tab by mouth daily for the last day 01/18/22   Debby Freiberg, NP    Family History Family History  Problem Relation Age of Onset   Colon cancer Mother    Hypertension Mother    Hodgkin's lymphoma Sister    Kidney disease Brother    Cancer Brother    Stroke Maternal Grandfather     Social History Social History   Tobacco Use   Smoking status: Former    Packs/day: 0.75    Years: 15.00    Additional pack years: 0.00    Total pack years: 11.25    Types: Cigarettes    Quit date: 03/22/2013    Years since quitting: 9.5   Smokeless tobacco: Never  Vaping Use   Vaping Use: Every day  Substance Use Topics   Alcohol use: Yes    Alcohol/week: 7.0  standard drinks of alcohol    Types: 7 Standard drinks or equivalent per week   Drug use: No     Allergies   Patient has no known allergies.   Review of Systems Review of Systems  Skin:        boil of right thigh x 2 weeks     Physical Exam Triage Vital Signs ED Triage Vitals  Enc Vitals Group     BP 09/20/22 1201 113/73     Pulse Rate 09/20/22 1201 75     Resp 09/20/22 1201 16     Temp 09/20/22 1201 97.7 F (36.5 C)     Temp Source 09/20/22 1201 Oral     SpO2 09/20/22 1201 100 %     Weight --      Height --      Head Circumference --      Peak Flow --      Pain Score 09/20/22 1206 2     Pain Loc --      Pain Edu? --      Excl. in GC? --    No data found.  Updated Vital Signs BP 113/73 (BP Location: Right Arm)   Pulse 75   Temp 97.7 F (36.5 C) (Oral)   Resp 16   SpO2 100%    Physical Exam Vitals and nursing note reviewed. Exam conducted with a chaperone present.  Constitutional:      Appearance: Normal appearance. She is obese.  HENT:     Head: Normocephalic and atraumatic.     Mouth/Throat:     Mouth: Mucous membranes are moist.     Pharynx: Oropharynx is clear.  Eyes:     Extraocular Movements: Extraocular movements intact.     Conjunctiva/sclera: Conjunctivae normal.     Pupils: Pupils are equal, round, and reactive to light.  Cardiovascular:     Rate and Rhythm: Normal rate and regular rhythm.     Pulses: Normal pulses.     Heart sounds: Normal heart sounds.  Pulmonary:     Effort: Pulmonary effort is normal.     Breath sounds: Normal breath sounds. No wheezing, rhonchi or rales.  Musculoskeletal:        General: Normal range of motion.     Cervical back: Normal range of motion and neck supple.  Skin:    General: Skin is warm and dry.  Comments: Right inner thigh inferior inguinal crease: Small (<0.5) oval shaped erythematous nonpustular papule  Neurological:     General: No focal deficit present.     Mental Status: She is alert and  oriented to person, place, and time. Mental status is at baseline.  Psychiatric:        Mood and Affect: Mood normal.        Behavior: Behavior normal.        Thought Content: Thought content normal.      UC Treatments / Results  Labs (all labs ordered are listed, but only abnormal results are displayed) Labs Reviewed - No data to display  EKG   Radiology No results found.  Procedures Procedures (including critical care time)  Medications Ordered in UC Medications - No data to display  Initial Impression / Assessment and Plan / UC Course  I have reviewed the triage vital signs and the nursing notes.  Pertinent labs & imaging results that were available during my care of the patient were reviewed by me and considered in my medical decision making (see chart for details).     MDM: 1.  Folliculitis-Rx'd Doxycycline 100 mg capsule twice daily x 10 days. Instructed patient take medication as directed with food to completion.  Encouraged increase daily water intake to 64 ounces per day while taking this medication.  Advised patient to avoid topical ointments or creams to this affected area.  Advised if symptoms worsen and/or unresolved please follow-up with PCP or here for further evaluation.  Patient discharged home, hemodynamically stable.  Final Clinical Impressions(s) / UC Diagnoses   Final diagnoses:  Folliculitis     Discharge Instructions      Instructed patient take medication as directed with food to completion.  Encouraged increase daily water intake to 64 ounces per day while taking this medication.  Advised patient to avoid topical ointments or creams to this affected area.  Advised if symptoms worsen and/or unresolved please follow-up with PCP or here for further evaluation.     ED Prescriptions     Medication Sig Dispense Auth. Provider   doxycycline (VIBRAMYCIN) 100 MG capsule Take 1 capsule (100 mg total) by mouth 2 (two) times daily for 10 days. 20  capsule Trevor Iha, FNP      PDMP not reviewed this encounter.   Trevor Iha, FNP 09/20/22 1246

## 2023-01-11 IMAGING — DX DG FOOT COMPLETE 3+V*L*
3 series · 3 of 3 positions shown · non-contrast
Comparison: None available

CLINICAL DATA: Heel pain for 1 month.

EXAM:
LEFT FOOT - COMPLETE 3+ VIEW

[foot ap wb]
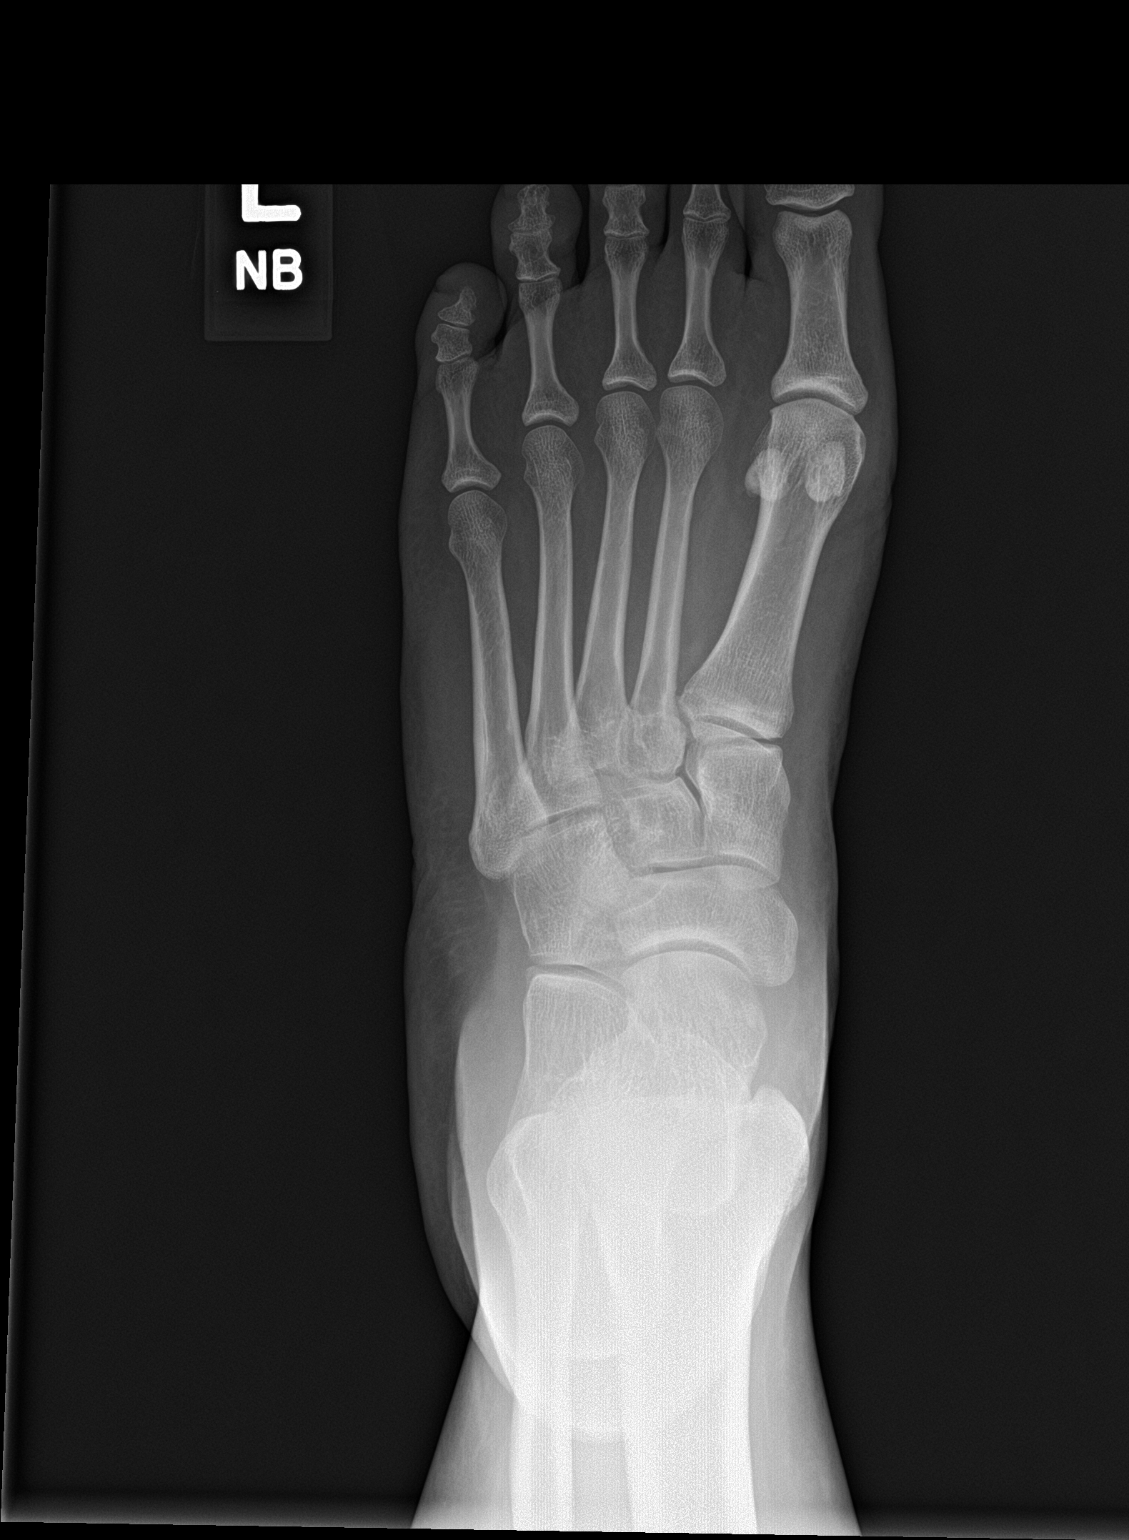

[foot obl wb]
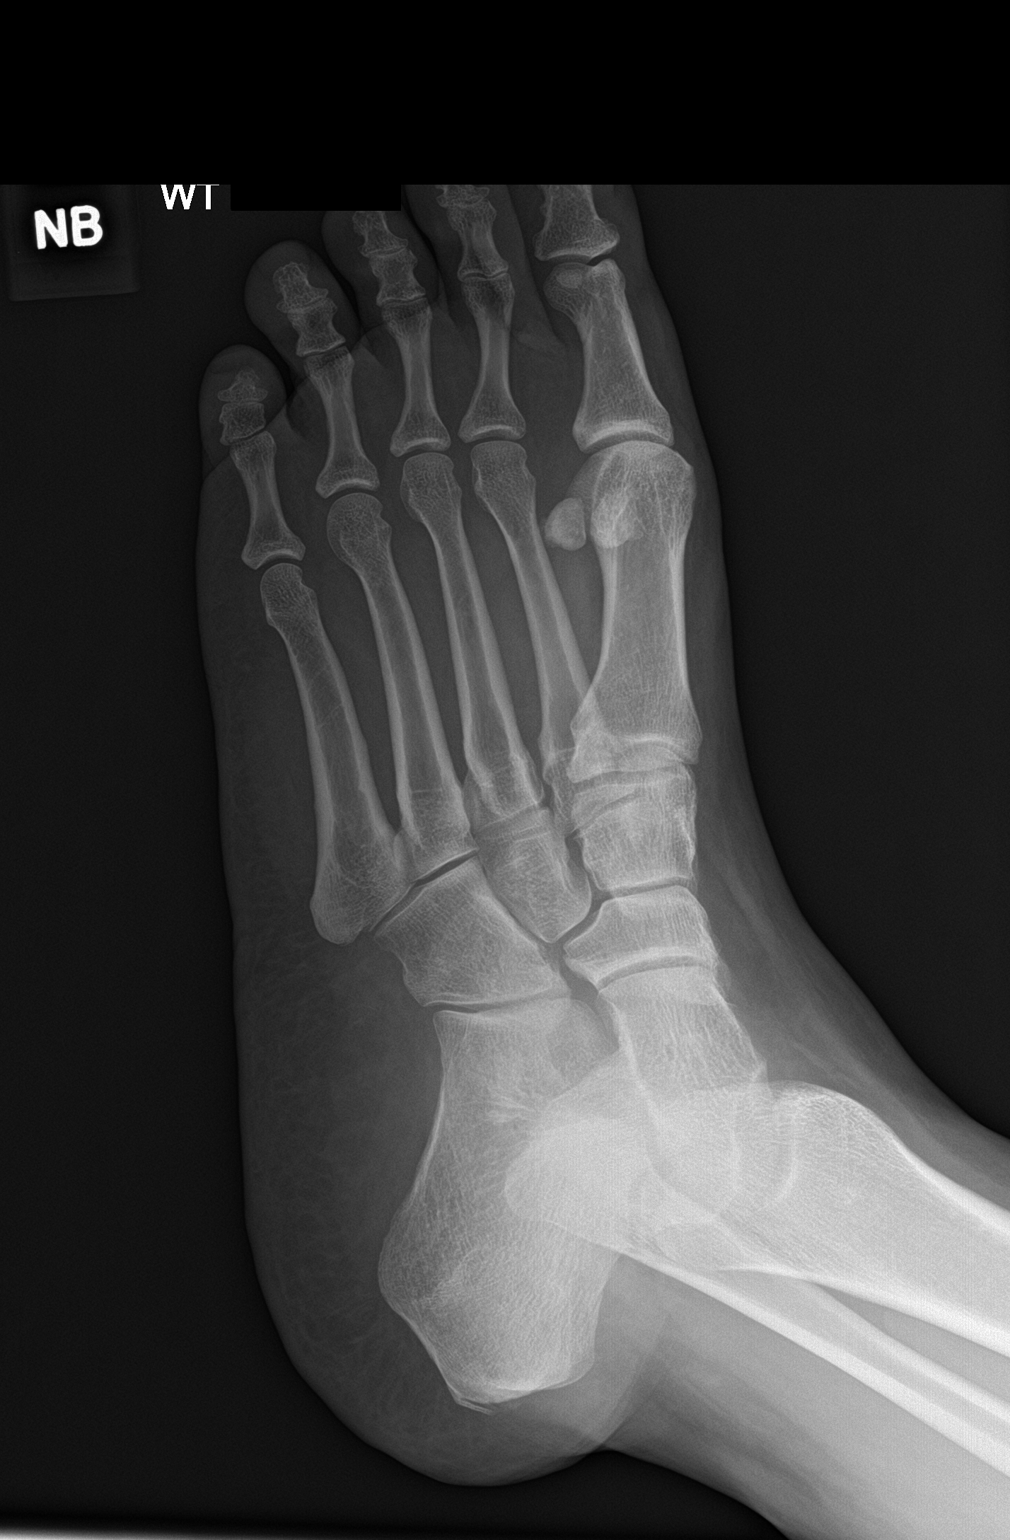

[foot lat wb]
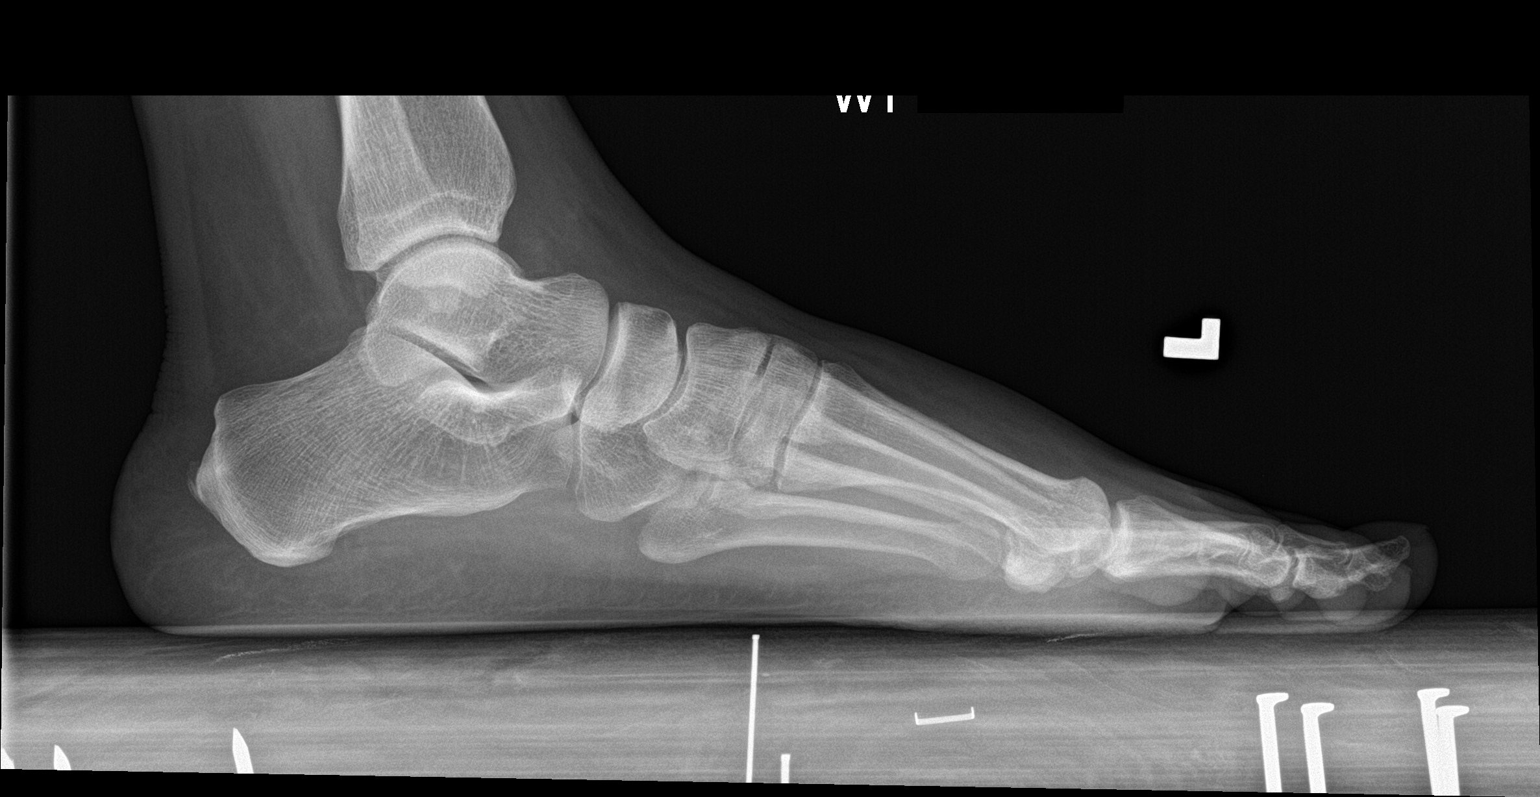

[3 of 3 positions shown; findings below may reference images not displayed]

FINDINGS: Minimal peripheral distal lateral great toe metatarsal head
degenerative spurring. Mild joint space narrowing of the
interphalangeal joints diffusely. No acute fracture or dislocation.
Minimal chronic spurring at the Achilles insertion on the calcaneus.
IMPRESSION: Minimal chronic spurring at the Achilles insertion on the calcaneus.

## 2023-02-12 ENCOUNTER — Encounter: Payer: Self-pay | Admitting: Family Medicine

## 2023-02-12 ENCOUNTER — Ambulatory Visit (INDEPENDENT_AMBULATORY_CARE_PROVIDER_SITE_OTHER): Payer: 59 | Admitting: Family Medicine

## 2023-02-12 VITALS — BP 108/70 | HR 80 | Temp 98.2°F | Resp 18 | Ht 69.0 in | Wt 193.0 lb

## 2023-02-12 DIAGNOSIS — Z7689 Persons encountering health services in other specified circumstances: Secondary | ICD-10-CM | POA: Diagnosis not present

## 2023-02-12 DIAGNOSIS — Z23 Encounter for immunization: Secondary | ICD-10-CM

## 2023-02-12 DIAGNOSIS — Z13220 Encounter for screening for lipoid disorders: Secondary | ICD-10-CM

## 2023-02-12 DIAGNOSIS — Z136 Encounter for screening for cardiovascular disorders: Secondary | ICD-10-CM

## 2023-02-12 DIAGNOSIS — R7302 Impaired glucose tolerance (oral): Secondary | ICD-10-CM | POA: Diagnosis not present

## 2023-02-12 DIAGNOSIS — Z0000 Encounter for general adult medical examination without abnormal findings: Secondary | ICD-10-CM | POA: Diagnosis not present

## 2023-02-12 DIAGNOSIS — Z113 Encounter for screening for infections with a predominantly sexual mode of transmission: Secondary | ICD-10-CM

## 2023-02-12 DIAGNOSIS — Z1231 Encounter for screening mammogram for malignant neoplasm of breast: Secondary | ICD-10-CM

## 2023-02-12 NOTE — Progress Notes (Signed)
Complete physical exam and Establish Care  Patient: Alexandra Turner   DOB: [birth year]   47 y.o. Female  MRN: [id number]  Subjective:    Chief Complaint  Patient presents with   Establish Care    Patient is here to establish care with a new PCP    Alexandra Turner is a 47 y.o. female who presents today for a complete physical exam. She reports consuming a  mindful of eating  diet.  Walking 4 days a week.  She generally feels well. She reports sleeping well. She does not have additional problems to discuss today.    Most recent fall risk assessment:    02/12/2023    9:20 AM  Fall Risk   Falls in the past year? 0  Number falls in past yr: 0  Injury with Fall? 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 02/03/2021   Sacroiliac joint dysfunction of right side 02/03/2021   Trichomonal vaginitis 04/05/2020   History of anemia 08/05/2017   Vitamin D deficiency 08/05/2017   Former smoker 08/05/2017   Intercostal pain 08/05/2017   Cervical high risk HPV (human papillomavirus) test positive 07/25/2017   Anxiety 07/25/2017   Dysmenorrhea 07/25/2017   Stenosis of cervix 07/25/2017   Nicotine dependence 07/25/2017   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   Family history of colon cancer 08/12/2014   History of abnormal cervical Pap smear 08/12/2014   Sickle cell trait (HCC) 09/28/2012   Acne 09/28/2012   Past Medical History:  Diagnosis Date   Depression    Plantar fasciitis    Sickle cell trait (HCC)    Vitamin D deficiency    Past Surgical History:  Procedure Laterality Date   BREAST EXCISIONAL BIOPSY Left    BREAST SURGERY     excision of benign mass, unknown laterality   CESAREAN SECTION      INTRAUTERINE DEVICE (IUD) INSERTION     Social History   Tobacco Use   Smoking status: Former    Current packs/day: 0.00    Average packs/day: 0.8 packs/day for 15.0 years (11.3 ttl pk-yrs)    Types: Cigarettes    Start date: 03/22/1998    Quit date: 03/22/2013    Years since quitting: 9.9   Smokeless tobacco: Never  Vaping Use   Vaping status: Every Day  Substance Use Topics   Alcohol use: Yes    Alcohol/week: 7.0 standard drinks of alcohol    Types: 7 Standard drinks or equivalent per week    Comment: occ   Drug use: No   Family Status  Relation Name Status   Mother  Alive   Father  Deceased       Estranged   Sister  (Not Specified)   Brother  Deceased   MGF  (Not Specified)  No partnership data on file   Family History  Problem Relation Age of Onset   Colon cancer Mother    Hypertension Mother    Hodgkin's lymphoma Sister    Kidney disease Brother    Cancer Brother    Stroke Maternal Grandfather    No Known Allergies  Patient Care Team: Pcp, No as PCP - General Alm Bustard, MD as Consulting Physician (Obstetrics and Gynecology)   Outpatient Medications Prior to Visit  Medication Sig   levonorgestrel (MIRENA, 52 MG,) 20 MCG/24HR IUD Mirena 20 mcg/24 hours (5 yrs) 52 mg intrauterine device   [DISCONTINUED] Cholecalciferol 1.25 MG (50000 UT) capsule cholecalciferol (vitamin D3) 1,250 mcg (50,000 unit) capsule (Patient not taking: Reported on 02/12/2023)   [DISCONTINUED] cyclobenzaprine (FLEXERIL) 5 MG tablet Take 1-2 tablets (5-10 mg total) by mouth at bedtime as needed for muscle spasms. (Patient not taking: Reported on 02/12/2023)   [DISCONTINUED] Ergocalciferol 2500 units CAPS Take 5,000 Units by mouth daily.   [DISCONTINUED] ibuprofen (ADVIL) 400 MG tablet Take 200 mg by mouth every 6 (six) hours as needed.   [DISCONTINUED] meloxicam (MOBIC) 15 MG tablet Take 1 tablet (15 mg total) by mouth daily.   [DISCONTINUED] metroNIDAZOLE (METROGEL) 0.75 % vaginal  gel metronidazole 0.75 % (37.5 mg/5 gram) vaginal gel   [DISCONTINUED] omeprazole (PRILOSEC) 20 MG capsule Take 1 capsule (20 mg total) by mouth daily.   [DISCONTINUED] predniSONE (STERAPRED UNI-PAK 21 TAB) 10 MG (21) TBPK tablet Take 6 tabs by mouth daily for 1st day , then 5 tabs for 2nd day, then 4 tabs for 3rd day , then 3 tabs for 4th day, 2 tabs for 5th day , then 1 tab by mouth daily for the last day   No facility-administered medications prior to visit.    Review of Systems  All other systems reviewed and are negative.        Objective:     BP 108/70   Pulse 80   Temp 98.2 F (36.8 C) (Oral)   Resp 18   Ht 5\' 9"  (1.753 m)   Wt 193 lb (87.5 kg)   SpO2 98%   BMI 28.50 kg/m  BP Readings from Last 3 Encounters:  02/12/23 108/70  09/20/22 113/73  01/18/22 133/79      Physical Exam Vitals and nursing note reviewed.  Constitutional:      Appearance: Normal appearance. She is normal weight.  HENT:     Head: Normocephalic and atraumatic.     Right Ear: Tympanic membrane, ear canal and external ear normal.     Left Ear: Tympanic membrane, ear canal and external ear normal.     Nose: Nose normal.     Mouth/Throat:     Mouth: Mucous membranes are moist.     Pharynx: Oropharynx is clear.  Eyes:     Conjunctiva/sclera: Conjunctivae normal.     Pupils: Pupils are equal, round, and reactive to light.  Cardiovascular:     Rate and Rhythm: Normal rate and regular rhythm.     Pulses: Normal pulses.     Heart sounds: Normal heart sounds.  Pulmonary:     Effort: Pulmonary effort is normal.     Breath sounds: Normal breath sounds.  Abdominal:     General: Abdomen is flat. Bowel sounds are normal.  Skin:    General: Skin is warm.     Capillary Refill: Capillary refill takes less than 2 seconds.  Neurological:     General: No focal deficit present.     Mental Status: She is alert and oriented to person, place, and time. Mental status is at baseline.  Psychiatric:         Mood and Affect: Mood normal.        Behavior: Behavior normal.        Thought Content:  Thought content normal.        Judgment: Judgment normal.     No results found for any visits on 02/12/23. Last CBC Lab Results  Component Value Date   WBC 4.5 01/18/2022   HGB 11.7 01/18/2022   HCT 34.4 (L) 01/18/2022   MCV 87.1 01/18/2022   MCH 29.6 01/18/2022   RDW 13.0 01/18/2022   PLT 311 01/18/2022   Last metabolic panel Lab Results  Component Value Date   GLUCOSE 89 07/25/2017   NA 140 07/25/2017   K 3.8 07/25/2017   CL 104 07/25/2017   CO2 27 07/25/2017   BUN 7 07/25/2017   CREATININE 0.68 07/25/2017   GFRNONAA >89 08/19/2014   CALCIUM 9.4 07/25/2017   PROT 6.9 07/25/2017   ALBUMIN 4.2 08/19/2014   BILITOT 0.5 07/25/2017   ALKPHOS 61 08/19/2014   AST 14 07/25/2017   ALT 10 07/25/2017   Last lipids Lab Results  Component Value Date   CHOL 169 08/19/2014   HDL 72 08/19/2014   LDLCALC 86 08/19/2014   TRIG 54 08/19/2014   CHOLHDL 2.3 08/19/2014   Last hemoglobin A1c No results found for: "HGBA1C"      Assessment & Plan:    Routine Health Maintenance and Physical Exam  Immunization History  Administered Date(s) Administered   Influenza,inj,Quad PF,6+ Mos 02/26/2019   Tdap 06/23/2010    Health Maintenance  Topic Date Due   COVID-19 Vaccine (1) Never done   Hepatitis C Screening  Never done   Cervical Cancer Screening (HPV/Pap Cotest)  07/10/2019   DTaP/Tdap/Td (2 - Td or Tdap) 06/22/2020   INFLUENZA VACCINE  11/23/2022   Colonoscopy  10/02/2030   HIV Screening  Completed   HPV VACCINES  Aged Out    Discussed health benefits of physical activity, and encouraged her to engage in regular exercise appropriate for her age and condition.  Problem List Items Addressed This Visit   None  No follow-ups on file. Annual physical exam  Encounter to establish care with new doctor  Encounter for lipid screening for cardiovascular disease -     Lipid  panel  Impaired glucose tolerance -     CBC with Differential/Platelet -     Comprehensive metabolic panel -     Hemoglobin A1c  Need for influenza vaccination -     Flu vaccine trivalent PF, 6mos and older(Flulaval,Afluria,Fluarix,Fluzone)  Screening mammogram for breast cancer -     3D Screening Mammogram, Left and Right; Future  Screening examination for STD (sexually transmitted disease) -     Chlamydia/Gonococcus/Trichomonas, NAA -     HepB+HepC+HIV Panel   Screening labs including STD screening per pt's request Flu vaccine Refer for mammogram See in 1 year sooner prn.    Suzan Slick, MD

## 2023-02-13 LAB — LIPID PANEL
Chol/HDL Ratio: 3.2 ratio (ref 0.0–4.4)
Cholesterol, Total: 228 mg/dL — ABNORMAL HIGH (ref 100–199)
HDL: 72 mg/dL (ref >39)
LDL Chol Calc (NIH): 145 mg/dL — ABNORMAL HIGH (ref 0–99)
Triglycerides: 63 mg/dL (ref 0–149)
VLDL Cholesterol Cal: 11 mg/dL (ref 5–40)

## 2023-02-13 LAB — CBC WITH DIFFERENTIAL/PLATELET
Basophils Absolute: 0 10*3/uL (ref 0.0–0.2)
Basos: 0 %
EOS (ABSOLUTE): 0.1 10*3/uL (ref 0.0–0.4)
Eos: 1 %
Hematocrit: 37.8 % (ref 34.0–46.6)
Hemoglobin: 12.6 g/dL (ref 11.1–15.9)
Immature Grans (Abs): 0 10*3/uL (ref 0.0–0.1)
Immature Granulocytes: 0 %
Lymphocytes Absolute: 1.5 10*3/uL (ref 0.7–3.1)
Lymphs: 32 %
MCH: 29.4 pg (ref 26.6–33.0)
MCHC: 33.3 g/dL (ref 31.5–35.7)
MCV: 88 fL (ref 79–97)
Monocytes Absolute: 0.3 10*3/uL (ref 0.1–0.9)
Monocytes: 7 %
Neutrophils Absolute: 2.7 10*3/uL (ref 1.4–7.0)
Neutrophils: 60 %
Platelets: 340 10*3/uL (ref 150–450)
RBC: 4.29 x10E6/uL (ref 3.77–5.28)
RDW: 13.7 % (ref 11.7–15.4)
WBC: 4.7 10*3/uL (ref 3.4–10.8)

## 2023-02-13 LAB — COMPREHENSIVE METABOLIC PANEL
ALT: 14 [IU]/L (ref 0–32)
AST: 17 [IU]/L (ref 0–40)
Albumin: 4.7 g/dL (ref 3.9–4.9)
Alkaline Phosphatase: 60 [IU]/L (ref 44–121)
BUN/Creatinine Ratio: 7 — ABNORMAL LOW (ref 9–23)
BUN: 6 mg/dL (ref 6–24)
Bilirubin Total: 0.5 mg/dL (ref 0.0–1.2)
CO2: 22 mmol/L (ref 20–29)
Calcium: 9.8 mg/dL (ref 8.7–10.2)
Chloride: 101 mmol/L (ref 96–106)
Creatinine, Ser: 0.82 mg/dL (ref 0.57–1.00)
Globulin, Total: 2.8 g/dL (ref 1.5–4.5)
Glucose: 90 mg/dL (ref 70–99)
Potassium: 4.5 mmol/L (ref 3.5–5.2)
Sodium: 140 mmol/L (ref 134–144)
Total Protein: 7.5 g/dL (ref 6.0–8.5)
eGFR: 90 mL/min/{1.73_m2} (ref >59)

## 2023-02-13 LAB — HEMOGLOBIN A1C
Est. average glucose Bld gHb Est-mCnc: 120 mg/dL
Hgb A1c MFr Bld: 5.8 % — ABNORMAL HIGH (ref 4.8–5.6)

## 2023-02-13 LAB — HEPB+HEPC+HIV PANEL
HIV Screen 4th Generation wRfx: NONREACTIVE
Hep B C IgM: NEGATIVE
Hep B Core Total Ab: NEGATIVE
Hep B E Ab: NONREACTIVE
Hep B E Ag: NEGATIVE
Hep B Surface Ab, Qual: NONREACTIVE
Hep C Virus Ab: NONREACTIVE
Hepatitis B Surface Ag: NEGATIVE

## 2023-02-14 LAB — CHLAMYDIA/GONOCOCCUS/TRICHOMONAS, NAA
Chlamydia by NAA: NEGATIVE
Gonococcus by NAA: NEGATIVE
Trich vag by NAA: NEGATIVE

## 2024-02-12 ENCOUNTER — Other Ambulatory Visit: Payer: Self-pay | Admitting: Family Medicine

## 2024-02-12 DIAGNOSIS — Z1231 Encounter for screening mammogram for malignant neoplasm of breast: Secondary | ICD-10-CM

## 2024-02-25 ENCOUNTER — Ambulatory Visit (HOSPITAL_BASED_OUTPATIENT_CLINIC_OR_DEPARTMENT_OTHER)
Admission: RE | Admit: 2024-02-25 | Discharge: 2024-02-25 | Disposition: A | Discharge status: Home / Self Care | Source: Ambulatory Visit | Attending: Family Medicine | Admitting: Family Medicine

## 2024-02-25 ENCOUNTER — Encounter (HOSPITAL_BASED_OUTPATIENT_CLINIC_OR_DEPARTMENT_OTHER): Payer: Self-pay

## 2024-02-25 DIAGNOSIS — Z1231 Encounter for screening mammogram for malignant neoplasm of breast: Secondary | ICD-10-CM | POA: Diagnosis present

## 2024-02-27 ENCOUNTER — Ambulatory Visit: Payer: Self-pay | Admitting: Family Medicine

## 2024-02-27 ENCOUNTER — Other Ambulatory Visit: Payer: Self-pay | Admitting: Family Medicine

## 2024-02-27 DIAGNOSIS — R928 Other abnormal and inconclusive findings on diagnostic imaging of breast: Secondary | ICD-10-CM

## 2024-03-14 ENCOUNTER — Encounter

## 2024-03-14 ENCOUNTER — Other Ambulatory Visit

## 2024-04-02 ENCOUNTER — Inpatient Hospital Stay
Admission: RE | Admit: 2024-04-02 | Discharge: 2024-04-02 | Disposition: A | Source: Ambulatory Visit | Attending: Family Medicine

## 2024-04-02 ENCOUNTER — Ambulatory Visit

## 2024-04-02 ENCOUNTER — Ambulatory Visit: Payer: Self-pay | Admitting: Family Medicine

## 2024-04-02 DIAGNOSIS — R928 Other abnormal and inconclusive findings on diagnostic imaging of breast: Secondary | ICD-10-CM
